# Patient Record
Sex: Male | Born: 1935 | Race: White | Hispanic: No | Marital: Single | Smoking: Former smoker
Health system: Southern US, Community
[De-identification: ages and names within clinical notes are randomized; demographics above are authoritative.]

## PROBLEM LIST (undated history)

## (undated) DIAGNOSIS — R4781 Slurred speech: Secondary | ICD-10-CM

## (undated) DIAGNOSIS — R531 Weakness: Secondary | ICD-10-CM

## (undated) DIAGNOSIS — I639 Cerebral infarction, unspecified: Secondary | ICD-10-CM

## (undated) DIAGNOSIS — Z720 Tobacco use: Secondary | ICD-10-CM

## (undated) DIAGNOSIS — I10 Essential (primary) hypertension: Secondary | ICD-10-CM

## (undated) DIAGNOSIS — J449 Chronic obstructive pulmonary disease, unspecified: Secondary | ICD-10-CM

---

## 2016-01-03 ENCOUNTER — Encounter: Payer: Self-pay | Admitting: Podiatry

## 2016-01-03 ENCOUNTER — Ambulatory Visit (INDEPENDENT_AMBULATORY_CARE_PROVIDER_SITE_OTHER): Payer: No Typology Code available for payment source | Admitting: Podiatry

## 2016-01-03 DIAGNOSIS — B351 Tinea unguium: Secondary | ICD-10-CM

## 2016-01-03 DIAGNOSIS — M79676 Pain in unspecified toe(s): Secondary | ICD-10-CM

## 2016-01-03 DIAGNOSIS — Q828 Other specified congenital malformations of skin: Secondary | ICD-10-CM

## 2016-01-03 NOTE — Progress Notes (Signed)
   Subjective:    Patient ID: Erik Carey, male    DOB: [birth year], 81 y.o.   MRN: [id number]  HPI: He presents today chief complaint of painful lesions sub-first metatarsophalangeal joint of the left foot. He denies any trauma states this been there for about 6 months denies any foreign bodies. He states that his toenails are long thick and painful Hemovac had been trimmed if possible.    Review of Systems  Constitutional: Positive for diaphoresis and fatigue.  HENT: Positive for hearing loss, sinus pressure, sneezing and sore throat.   Eyes: Positive for itching and visual disturbance.  Respiratory: Positive for apnea, cough, shortness of breath and wheezing.   Gastrointestinal: Positive for diarrhea.  Endocrine: Positive for polydipsia and polyuria.  Musculoskeletal: Positive for arthralgias and gait problem.  Neurological: Positive for dizziness, weakness, light-headedness and numbness.  All other systems reviewed and are negative.      Objective:   Physical Exam: Vital signs are stable he is alert and oriented 3. Pulses are strongly palpable. Neurologic sensorium is intact. Degenerative flexor intact. Orthopedic evaluation shows all joints distal ankle full range of motion but crepitation. Cutaneous evaluation shows supple well-hydrated cutis no erythema edema saline as drainage or odor mild xerotic tissue to the plantar aspect of bilateral foot but states it's been like this for his entire life. He has a radioactive hyperkeratotic cornified lesion to the plantar aspect of the first metatarsophalangeal joint of the left foot with thick yellow dystrophic onychomycotic nails bilaterally.        Assessment & Plan:  Pain limb secondary to onychomycosis and porokeratosis.  Plan: Debridement of all reactive hyperkeratoses and debridement of nails 1 through 5 bilateral.

## 2017-07-31 ENCOUNTER — Ambulatory Visit (INDEPENDENT_AMBULATORY_CARE_PROVIDER_SITE_OTHER): Payer: Medicare Other | Admitting: Podiatry

## 2017-07-31 ENCOUNTER — Encounter: Payer: Self-pay | Admitting: Podiatry

## 2017-07-31 DIAGNOSIS — M79676 Pain in unspecified toe(s): Secondary | ICD-10-CM | POA: Diagnosis not present

## 2017-07-31 DIAGNOSIS — B351 Tinea unguium: Secondary | ICD-10-CM

## 2017-07-31 DIAGNOSIS — Q828 Other specified congenital malformations of skin: Secondary | ICD-10-CM | POA: Diagnosis not present

## 2017-07-31 NOTE — Progress Notes (Signed)
Complaint:  Visit Type: Patient returns to my office for continued preventative foot care services. Complaint: Patient states" my nails have grown long and thick and become painful to walk and wear shoes" Patient has a painful callus on the bottom of his left foot.  He says this callus is painful walking and wearing his shoes . The patient presents for preventative foot care services. No changes to ROS  Podiatric Exam: Vascular: dorsalis pedis and posterior tibial pulses are palpable bilateral. Capillary return is immediate. Temperature gradient is WNL. Skin turgor WNL  Sensorium: Normal Semmes Weinstein monofilament test. Normal tactile sensation bilaterally. Nail Exam: Pt has thick disfigured discolored nails with subungual debris noted bilateral entire nail hallux through fifth toenails Ulcer Exam: There is no evidence of ulcer or pre-ulcerative changes or infection. Orthopedic Exam: Muscle tone and strength are WNL. No limitations in general ROM. No crepitus or effusions noted. Foot type and digits show no abnormalities. Bony prominences are unremarkable. Skin:  Porokeratosis sub 1 left foot.. No infection or ulcers  Diagnosis:  Onychomycosis, , Pain in right toe, pain in left toes  Porokeratosis left foot.  Treatment & Plan Procedures and Treatment: Consent by patient was obtained for treatment procedures.   Debridement of mycotic and hypertrophic toenails, 1 through 5 bilateral and clearing of subungual debris. No ulceration, no infection noted. Debride porokeratosis Return Visit-Office Procedure: Patient instructed to return to the office for a follow up visit 3 months for continued evaluation and treatment.    Gardiner Barefoot DPM

## 2017-08-27 ENCOUNTER — Telehealth: Payer: Self-pay | Admitting: Podiatry

## 2017-08-27 NOTE — Telephone Encounter (Signed)
This is Erik Carey calling from the [facility]. We are needing the office visit notes from date of service 31 July 2017. Please fax those to [phone]. Thank you and have a great day.

## 2017-11-03 ENCOUNTER — Ambulatory Visit: Payer: No Typology Code available for payment source | Admitting: Podiatry

## 2017-11-13 ENCOUNTER — Ambulatory Visit: Payer: Non-veteran care | Admitting: Podiatry

## 2017-11-20 ENCOUNTER — Ambulatory Visit (INDEPENDENT_AMBULATORY_CARE_PROVIDER_SITE_OTHER): Payer: Medicare Other | Admitting: Podiatry

## 2017-11-20 ENCOUNTER — Encounter: Payer: Self-pay | Admitting: Podiatry

## 2017-11-20 DIAGNOSIS — B351 Tinea unguium: Secondary | ICD-10-CM | POA: Diagnosis not present

## 2017-11-20 DIAGNOSIS — M79676 Pain in unspecified toe(s): Secondary | ICD-10-CM

## 2017-11-20 NOTE — Progress Notes (Signed)
Complaint:  Visit Type: Patient returns to my office for continued preventative foot care services. Complaint: Patient states" my nails have grown long and thick and become painful to walk and wear shoes" Patient has a painful callus on the bottom of his left foot.  He says this callus is painful walking and wearing his shoes . The patient presents for preventative foot care services. No changes to ROS  Podiatric Exam: Vascular: dorsalis pedis and posterior tibial pulses are palpable bilateral. Capillary return is immediate. Temperature gradient is WNL. Skin turgor WNL  Sensorium: Normal Semmes Weinstein monofilament test. Normal tactile sensation bilaterally. Nail Exam: Pt has thick disfigured discolored nails with subungual debris noted bilateral entire nail hallux through fifth toenails Ulcer Exam: There is no evidence of ulcer or pre-ulcerative changes or infection. Orthopedic Exam: Muscle tone and strength are WNL. No limitations in general ROM. No crepitus or effusions noted. Foot type and digits show no abnormalities. Bony prominences are unremarkable. Skin:  Porokeratosis sub 1 left foot.. No infection or ulcers  Diagnosis:  Onychomycosis, , Pain in right toe, pain in left toes   Treatment & Plan Procedures and Treatment: Consent by patient was obtained for treatment procedures.   Debridement of mycotic and hypertrophic toenails, 1 through 5 bilateral and clearing of subungual debris. No ulceration, no infection noted.  Return Visit-Office Procedure: Patient instructed to return to the office for a follow up visit 3 months for continued evaluation and treatment.    Gardiner Barefoot DPM

## 2017-12-15 ENCOUNTER — Emergency Department: Payer: Medicare Other

## 2017-12-15 ENCOUNTER — Other Ambulatory Visit: Payer: Self-pay

## 2017-12-15 ENCOUNTER — Inpatient Hospital Stay
Admission: EM | Admit: 2017-12-15 | Discharge: 2017-12-18 | DRG: 872 | Disposition: A | Discharge status: Skilled Nursing Facility | Payer: Medicare Other | Attending: Internal Medicine | Admitting: Internal Medicine

## 2017-12-15 DIAGNOSIS — J441 Chronic obstructive pulmonary disease with (acute) exacerbation: Secondary | ICD-10-CM | POA: Diagnosis present

## 2017-12-15 DIAGNOSIS — N179 Acute kidney failure, unspecified: Secondary | ICD-10-CM

## 2017-12-15 DIAGNOSIS — L03116 Cellulitis of left lower limb: Secondary | ICD-10-CM | POA: Diagnosis present

## 2017-12-15 DIAGNOSIS — Z831 Family history of other infectious and parasitic diseases: Secondary | ICD-10-CM | POA: Diagnosis not present

## 2017-12-15 DIAGNOSIS — K529 Noninfective gastroenteritis and colitis, unspecified: Secondary | ICD-10-CM | POA: Diagnosis present

## 2017-12-15 DIAGNOSIS — Z7951 Long term (current) use of inhaled steroids: Secondary | ICD-10-CM | POA: Diagnosis not present

## 2017-12-15 DIAGNOSIS — L89220 Pressure ulcer of left hip, unstageable: Secondary | ICD-10-CM

## 2017-12-15 DIAGNOSIS — F17210 Nicotine dependence, cigarettes, uncomplicated: Secondary | ICD-10-CM | POA: Diagnosis present

## 2017-12-15 DIAGNOSIS — I10 Essential (primary) hypertension: Secondary | ICD-10-CM | POA: Diagnosis present

## 2017-12-15 DIAGNOSIS — Z66 Do not resuscitate: Secondary | ICD-10-CM | POA: Diagnosis present

## 2017-12-15 DIAGNOSIS — E860 Dehydration: Secondary | ICD-10-CM | POA: Diagnosis present

## 2017-12-15 DIAGNOSIS — W06XXXA Fall from bed, initial encounter: Secondary | ICD-10-CM | POA: Diagnosis present

## 2017-12-15 DIAGNOSIS — Z79899 Other long term (current) drug therapy: Secondary | ICD-10-CM | POA: Diagnosis not present

## 2017-12-15 DIAGNOSIS — Z716 Tobacco abuse counseling: Secondary | ICD-10-CM | POA: Diagnosis not present

## 2017-12-15 DIAGNOSIS — L304 Erythema intertrigo: Secondary | ICD-10-CM | POA: Diagnosis present

## 2017-12-15 DIAGNOSIS — M6282 Rhabdomyolysis: Secondary | ICD-10-CM | POA: Diagnosis present

## 2017-12-15 DIAGNOSIS — Z8673 Personal history of transient ischemic attack (TIA), and cerebral infarction without residual deficits: Secondary | ICD-10-CM

## 2017-12-15 DIAGNOSIS — L89222 Pressure ulcer of left hip, stage 2: Secondary | ICD-10-CM | POA: Diagnosis present

## 2017-12-15 DIAGNOSIS — Z23 Encounter for immunization: Secondary | ICD-10-CM

## 2017-12-15 DIAGNOSIS — A419 Sepsis, unspecified organism: Secondary | ICD-10-CM | POA: Diagnosis not present

## 2017-12-15 DIAGNOSIS — L8990 Pressure ulcer of unspecified site, unspecified stage: Secondary | ICD-10-CM

## 2017-12-15 HISTORY — DX: Cerebral infarction, unspecified: I63.9

## 2017-12-15 HISTORY — DX: Chronic obstructive pulmonary disease, unspecified: J44.9

## 2017-12-15 HISTORY — DX: Tobacco use: Z72.0

## 2017-12-15 HISTORY — DX: Slurred speech: R47.81

## 2017-12-15 HISTORY — DX: Weakness: R53.1

## 2017-12-15 HISTORY — DX: Essential (primary) hypertension: I10

## 2017-12-15 LAB — URINALYSIS, COMPLETE (UACMP) WITH MICROSCOPIC
BACTERIA UA: NONE SEEN
BILIRUBIN URINE: NEGATIVE
Glucose, UA: NEGATIVE mg/dL
KETONES UR: 20 mg/dL — AB
Leukocytes, UA: NEGATIVE
Nitrite: NEGATIVE
PROTEIN: 100 mg/dL — AB
SPECIFIC GRAVITY, URINE: 1.021 (ref 1.005–1.030)
pH: 5 (ref 5.0–8.0)

## 2017-12-15 LAB — COMPREHENSIVE METABOLIC PANEL
ALT: 27 U/L (ref 0–44)
AST: 136 U/L — ABNORMAL HIGH (ref 15–41)
Albumin: 3.6 g/dL (ref 3.5–5.0)
Alkaline Phosphatase: 61 U/L (ref 38–126)
Anion gap: 16 — ABNORMAL HIGH (ref 5–15)
BILIRUBIN TOTAL: 2.1 mg/dL — AB (ref 0.3–1.2)
BUN: 60 mg/dL — AB (ref 8–23)
CO2: 22 mmol/L (ref 22–32)
CREATININE: 1.92 mg/dL — AB (ref 0.61–1.24)
Calcium: 9 mg/dL (ref 8.9–10.3)
Chloride: 105 mmol/L (ref 98–111)
GFR calc Af Amer: 36 mL/min — ABNORMAL LOW (ref >60)
GFR, EST NON AFRICAN AMERICAN: 31 mL/min — AB (ref >60)
GLUCOSE: 108 mg/dL — AB (ref 70–99)
Potassium: 4.2 mmol/L (ref 3.5–5.1)
Sodium: 143 mmol/L (ref 135–145)
TOTAL PROTEIN: 7.2 g/dL (ref 6.5–8.1)

## 2017-12-15 LAB — CBC
HEMATOCRIT: 45.4 % (ref 40.0–52.0)
Hemoglobin: 15.8 g/dL (ref 13.0–18.0)
MCH: 32.6 pg (ref 26.0–34.0)
MCHC: 34.8 g/dL (ref 32.0–36.0)
MCV: 93.6 fL (ref 80.0–100.0)
Platelets: 180 10*3/uL (ref 150–440)
RBC: 4.85 MIL/uL (ref 4.40–5.90)
RDW: 15.4 % — ABNORMAL HIGH (ref 11.5–14.5)
WBC: 21.3 10*3/uL — ABNORMAL HIGH (ref 3.8–10.6)

## 2017-12-15 LAB — LACTIC ACID, PLASMA
LACTIC ACID, VENOUS: 1.5 mmol/L (ref 0.5–1.9)
LACTIC ACID, VENOUS: 1.4 mmol/L (ref 0.5–1.9)

## 2017-12-15 LAB — CK: CK TOTAL: 4550 U/L — AB (ref 49–397)

## 2017-12-15 MED ORDER — SODIUM CHLORIDE 0.9 % IV BOLUS
1000.0000 mL | Freq: Once | INTRAVENOUS | Status: AC
  Administered 2017-12-15: 1000 mL via INTRAVENOUS

## 2017-12-15 MED ORDER — ENOXAPARIN SODIUM 40 MG/0.4ML ~~LOC~~ SOLN
40.0000 mg | SUBCUTANEOUS | Status: DC
  Administered 2017-12-15 – 2017-12-17 (×3): 40 mg via SUBCUTANEOUS
  Filled 2017-12-15 (×3): qty 0.4

## 2017-12-15 MED ORDER — COLLAGENASE 250 UNIT/GM EX OINT
TOPICAL_OINTMENT | Freq: Every day | CUTANEOUS | Status: DC
  Administered 2017-12-15 – 2017-12-18 (×4): via TOPICAL
  Filled 2017-12-15: qty 30

## 2017-12-15 MED ORDER — ALBUTEROL SULFATE (2.5 MG/3ML) 0.083% IN NEBU
2.5000 mg | INHALATION_SOLUTION | RESPIRATORY_TRACT | Status: DC | PRN
  Administered 2017-12-16: 16:00:00 2.5 mg via RESPIRATORY_TRACT
  Filled 2017-12-15: qty 3

## 2017-12-15 MED ORDER — ACETAMINOPHEN 325 MG PO TABS
650.0000 mg | ORAL_TABLET | Freq: Four times a day (QID) | ORAL | Status: DC | PRN
  Administered 2017-12-18: 650 mg via ORAL
  Filled 2017-12-15: qty 2

## 2017-12-15 MED ORDER — HYDROCODONE-ACETAMINOPHEN 5-325 MG PO TABS
1.0000 | ORAL_TABLET | ORAL | Status: DC | PRN
Start: 2017-12-15 — End: 2017-12-18
  Administered 2017-12-15: 17:00:00 1 via ORAL
  Administered 2017-12-16: 16:00:00 2 via ORAL
  Administered 2017-12-16: 11:00:00 1 via ORAL
  Filled 2017-12-15 (×2): qty 2
  Filled 2017-12-15 (×2): qty 1

## 2017-12-15 MED ORDER — ONDANSETRON HCL 4 MG PO TABS: 4.0000 mg | ORAL_TABLET | Freq: Four times a day (QID) | ORAL | Status: DC | PRN

## 2017-12-15 MED ORDER — CEFAZOLIN SODIUM-DEXTROSE 1-4 GM/50ML-% IV SOLN
1.0000 g | Freq: Three times a day (TID) | INTRAVENOUS | Status: DC
  Administered 2017-12-15 – 2017-12-18 (×9): 1 g via INTRAVENOUS
  Filled 2017-12-15 (×13): qty 50

## 2017-12-15 MED ORDER — ONDANSETRON HCL 4 MG/2ML IJ SOLN: 4.0000 mg | Freq: Four times a day (QID) | INTRAMUSCULAR | Status: DC | PRN

## 2017-12-15 MED ORDER — TETANUS-DIPHTH-ACELL PERTUSSIS 5-2.5-18.5 LF-MCG/0.5 IM SUSP
0.5000 mL | Freq: Once | INTRAMUSCULAR | Status: AC
  Administered 2017-12-15: 0.5 mL via INTRAMUSCULAR
  Filled 2017-12-15: qty 0.5

## 2017-12-15 MED ORDER — IPRATROPIUM-ALBUTEROL 0.5-2.5 (3) MG/3ML IN SOLN
3.0000 mL | Freq: Four times a day (QID) | RESPIRATORY_TRACT | Status: DC
  Administered 2017-12-15 – 2017-12-16 (×3): 3 mL via RESPIRATORY_TRACT
  Filled 2017-12-15 (×3): qty 3

## 2017-12-15 MED ORDER — SODIUM CHLORIDE 0.9 % IV SOLN
INTRAVENOUS | Status: DC
  Administered 2017-12-15 – 2017-12-17 (×5): via INTRAVENOUS

## 2017-12-15 MED ORDER — POLYETHYLENE GLYCOL 3350 17 G PO PACK: 17.0000 g | PACK | Freq: Every day | ORAL | Status: DC | PRN

## 2017-12-15 MED ORDER — ACETAMINOPHEN 650 MG RE SUPP: 650.0000 mg | Freq: Four times a day (QID) | RECTAL | Status: DC | PRN

## 2017-12-15 MED ORDER — SODIUM CHLORIDE 0.9 % IV SOLN
2.0000 g | Freq: Once | INTRAVENOUS | Status: AC
  Administered 2017-12-15: 2 g via INTRAVENOUS
  Filled 2017-12-15: qty 20

## 2017-12-15 NOTE — ED Notes (Signed)
Pt given water and assisted with urinal at this time

## 2017-12-15 NOTE — Evaluation (Signed)
Physical Therapy Evaluation Patient Details Name: Erik Carey MRN: [id number] DOB: [birth year] Today's Date: 12/15/2017   History of Present Illness  83 yo male with onset of fall at home and was in the floor 3 days.  Has wounds from pressure on L Shoulder, L trochanter.  Chronic diarrhea and generalized weakness, sepsis, scrotal skin irritation.  PMHx:  COPD, CVA, chronic diarrhea, L hemiparesis, tobacco use, HTN  Clinical Impression  Pt is appropriate for SNF as he is too painful and weak to move effectively as well as having wounds to heal.  Progress gait and transfers as needed and able, as well as focusing on protection of skin during mobility. Follow acutely to decrease time needed in SNF.    Follow Up Recommendations SNF    Equipment Recommendations  None recommended by PT    Recommendations for Other Services       Precautions / Restrictions Precautions Precautions: Fall Restrictions Weight Bearing Restrictions: No      Mobility  Bed Mobility Overal bed mobility: Needs Assistance Bed Mobility: Supine to Sit;Sit to Supine     Supine to sit: Mod assist Sit to supine: Max assist   General bed mobility comments: painful and requires help to pivot and avoid stress on peri area  Transfers Overall transfer level: Needs assistance Equipment used: 1 person hand held assist Transfers: Sit to/from Stand Sit to Stand: Mod assist         General transfer comment: tolerates standing only briefly due to pain  Ambulation/Gait             General Gait Details: unable to tolerate  Stairs            Wheelchair Mobility    Modified Rankin (Stroke Patients Only)       Balance Overall balance assessment: Needs assistance Sitting-balance support: Feet supported;Bilateral upper extremity supported Sitting balance-Leahy Scale: Fair     Standing balance support: Single extremity supported;During functional activity Standing balance-Leahy Scale: Poor                                Pertinent Vitals/Pain Pain Assessment: Faces Faces Pain Scale: Hurts whole lot Pain Location: scrotum and L hip and shoulder Pain Descriptors / Indicators: Burning;Sharp;Tender Pain Intervention(s): Limited activity within patient's tolerance;Monitored during session;Premedicated before session;Repositioned    Home Living Family/patient expects to be discharged to:: Skilled nursing facility Living Arrangements: Alone                    Prior Function Level of Independence: Independent with assistive device(s)         Comments: used SPC with no assist previously     Hand Dominance        Extremity/Trunk Assessment   Upper Extremity Assessment Upper Extremity Assessment: Generalized weakness    Lower Extremity Assessment Lower Extremity Assessment: Generalized weakness    Cervical / Trunk Assessment Cervical / Trunk Assessment: Kyphotic  Communication   Communication: No difficulties  Cognition Arousal/Alertness: Lethargic Behavior During Therapy: Anxious Overall Cognitive Status: Within Functional Limits for tasks assessed                                 General Comments: pt is struggling to move due to skin breakdown      General Comments      Exercises     Assessment/Plan  PT Assessment Patient needs continued PT services  PT Problem List Decreased strength;Decreased range of motion;Decreased activity tolerance;Decreased balance;Decreased mobility;Decreased coordination;Decreased safety awareness;Pain;Decreased skin integrity;Obesity       PT Treatment Interventions DME instruction;Stair training;Gait training;Functional mobility training;Therapeutic activities;Balance training;Therapeutic exercise;Neuromuscular re-education;Patient/family education    PT Goals (Current goals can be found in the Care Plan section)  Acute Rehab PT Goals Patient Stated Goal: to avoid hurting PT Goal  Formulation: With patient/family Time For Goal Achievement: 12/29/17 Potential to Achieve Goals: Good    Frequency Min 2X/week   Barriers to discharge Decreased caregiver support;Inaccessible home environment home alone wiht stairs to enter house    Co-evaluation               AM-PAC PT "6 Clicks" Daily Activity  Outcome Measure Difficulty turning over in bed (including adjusting bedclothes, sheets and blankets)?: Unable Difficulty moving from lying on back to sitting on the side of the bed? : Unable Difficulty sitting down on and standing up from a chair with arms (e.g., wheelchair, bedside commode, etc,.)?: Unable Help needed moving to and from a bed to chair (including a wheelchair)?: A Lot Help needed walking in hospital room?: Total Help needed climbing 3-5 steps with a railing? : Total 6 Click Score: 7    End of Session   Activity Tolerance: Patient limited by pain;Treatment limited secondary to medical complications (Comment) Patient left: in bed;with call bell/phone within reach;with bed alarm set;with family/visitor present;with nursing/sitter in room Nurse Communication: Mobility status PT Visit Diagnosis: Unsteadiness on feet (R26.81);Muscle weakness (generalized) (M62.81);History of falling (Z91.81);Difficulty in walking, not elsewhere classified (R26.2);Pain Pain - Right/Left: Left Pain - part of body: Shoulder;Hip    Time: 2876-8115 PT Time Calculation (min) (ACUTE ONLY): 32 min   Charges:   PT Evaluation $PT Eval Moderate Complexity: 1 Mod PT Treatments $Therapeutic Activity: 8-22 mins        Ramond Dial 12/15/2017, 7:56 PM   Mee Hives, PT MS Acute Rehab Dept. Number: [place]

## 2017-12-15 NOTE — H&P (Addendum)
[facility] [place] NAME: Erik Carey    MR#:  [id number]  DATE OF BIRTH:  Oct 02, 1934  DATE OF ADMISSION:  12/15/2017  PRIMARY CARE PHYSICIAN: Raelyn Mora, MD   REQUESTING/REFERRING PHYSICIAN: Dr. Jacqualine Code  CHIEF COMPLAINT:   Chief Complaint  Patient presents with  . Fall    HISTORY OF PRESENT ILLNESS:  Erik Carey  is a 83 y.o. male with a known history of hypertension, COPD, CVA, chronic left weakness, chronic diarrhea presents to the emergency room brought in by the daughter after he has been laying on the floor for 3 days with urine and stool around him.  Patient does have chronic diarrhea which had been extensively worked up in the past by PCP with no cause found.  He could not get up after a fall.  At baseline he ambulates with a walker or holding onto things at home.  He complains of some left hip pain.  An x-ray has been done with no fractures.  He does have multiple decubitus pressure ulcers.  Surrounding erythema and some pain on the left hip wound.  No purulent discharge.  Found to have WBC 21,000 with heart rate 97.  Sepsis present on admission.   PAST MEDICAL HISTORY:   Past Medical History:  Diagnosis Date  . COPD (chronic obstructive pulmonary disease) ([place])   . CVA (cerebral vascular accident) ([place])   . Hypertension   . Left-sided weakness   . Slurred speech   . Tobacco use     PAST SURGICAL HISTORY:  History reviewed. No pertinent surgical history.  SOCIAL HISTORY:   Social History   Tobacco Use  . Smoking status: Unknown If Ever Smoked  . Smokeless tobacco: Never Used  Substance Use Topics  . Alcohol use: Not Currently    FAMILY HISTORY:   Family History  Problem Relation Age of Onset  . Tuberculosis Mother     DRUG ALLERGIES:  No Known Allergies  REVIEW OF SYSTEMS:   Review of Systems  Constitutional: Positive for malaise/fatigue. Negative for chills, fever and weight loss.  HENT: Negative  for hearing loss and nosebleeds.   Eyes: Negative for blurred vision, double vision and pain.  Respiratory: Positive for cough, shortness of breath and wheezing. Negative for hemoptysis and sputum production.   Cardiovascular: Negative for chest pain, palpitations, orthopnea and leg swelling.  Gastrointestinal: Positive for diarrhea. Negative for abdominal pain, constipation, nausea and vomiting.  Genitourinary: Negative for dysuria and hematuria.  Musculoskeletal: Positive for falls. Negative for back pain and myalgias.  Skin: Negative for rash.  Neurological: Negative for dizziness, tremors, sensory change, speech change, focal weakness, seizures and headaches.  Endo/Heme/Allergies: Does not bruise/bleed easily.  Psychiatric/Behavioral: Negative for depression and memory loss. The patient is not nervous/anxious.     MEDICATIONS AT HOME:   Prior to Admission medications   Medication Sig Start Date End Date Taking? Authorizing Provider  diphenoxylate-atropine (LOMOTIL) 2.5-0.025 MG tablet Take by mouth 4 (four) times daily as needed for diarrhea or loose stools.   Yes [provider]  allopurinol (ZYLOPRIM) 100 MG tablet Take 100 mg by mouth daily.    [provider]  budesonide-formoterol (SYMBICORT) 80-4.5 MCG/ACT inhaler Inhale 2 puffs into the lungs 2 (two) times daily.    [provider]  Chlorpheniramine Maleate (ALLERGY RELIEF PO) Take by mouth.    [provider]  hydrochlorothiazide (HYDRODIURIL) 25 MG tablet Take 25 mg by mouth daily.  [provider]  lisinopril (PRINIVIL,ZESTRIL) 40 MG tablet Take 40 mg by mouth daily.    [provider]  metoprolol succinate (TOPROL-XL) 50 MG 24 hr tablet Take 50 mg by mouth daily. Take with or immediately following a meal.    [provider]  pantoprazole (PROTONIX) 40 MG tablet Take 40 mg by mouth daily.    [provider]  simvastatin (ZOCOR) 40 MG tablet Take 40 mg by  mouth daily.    [provider]  tamsulosin (FLOMAX) 0.4 MG CAPS capsule Take 0.4 mg by mouth.    [provider]  tiotropium (SPIRIVA) 18 MCG inhalation capsule Place 18 mcg into inhaler and inhale daily.    [provider]     VITAL SIGNS:  Blood pressure 140/63, pulse 89, temperature 98.9 F (37.2 C), temperature source Oral, resp. rate (!) 22, height 5\' 9"  (1.753 m), weight 104.3 kg, SpO2 96 %.  PHYSICAL EXAMINATION:  Physical Exam  GENERAL:  83 y.o.-year-old patient lying in the bed with no acute distress.  EYES: Pupils equal, round, reactive to light and accommodation. No scleral icterus. Extraocular muscles intact.  HEENT: Head atraumatic, normocephalic. Oropharynx and nasopharynx clear. No oropharyngeal erythema, moist oral mucosa  NECK:  Supple, no jugular venous distention. No thyroid enlargement, no tenderness.  LUNGS: Good air entry with bilateral wheezing CARDIOVASCULAR: S1, S2 normal. No murmurs, rubs, or gallops.  ABDOMEN: Soft, nontender, nondistended. Bowel sounds present. No organomegaly or mass.  EXTREMITIES: No pedal edema, cyanosis, or clubbing. + 2 pedal & radial pulses b/l.   NEUROLOGIC: Cranial nerves II through XII are intact.  Left motor strength less than right motor strength And speech PSYCHIATRIC: The patient is alert and oriented x 3. Good affect.  SKIN: Multiple decubitus ulcers with a large wound present on the  left hip greater trochanter area.  Surrounding erythema. no discharge  LABORATORY PANEL:   CBC Recent Labs  Lab 12/15/17 1118  WBC 21.3*  HGB 15.8  HCT 45.4  PLT 180   ------------------------------------------------------------------------------------------------------------------  Chemistries  Recent Labs  Lab 12/15/17 1118  NA 143  K 4.2  CL 105  CO2 22  GLUCOSE 108*  BUN 60*  CREATININE 1.92*  CALCIUM 9.0  AST 136*  ALT 27  ALKPHOS 61  BILITOT 2.1*    ------------------------------------------------------------------------------------------------------------------  Cardiac Enzymes No results for input(s): TROPONINI in the last 168 hours. ------------------------------------------------------------------------------------------------------------------  RADIOLOGY:  Ct Head Wo Contrast  Result Date: 12/15/2017 CLINICAL DATA:  83 year old male with fall and head injury. Initial encounter. EXAM: CT HEAD WITHOUT CONTRAST TECHNIQUE: Contiguous axial images were obtained from the base of the skull through the vertex without intravenous contrast. COMPARISON:  None. FINDINGS: Brain: No evidence of acute infarction, hemorrhage, extra-axial collection or mass lesion/mass effect. Moderate ventriculomegaly identified. Atrophy, chronic small-vessel white matter ischemic changes and remote RIGHT parietal infarct noted. Vascular: Intracranial atherosclerotic calcifications noted. Skull: Normal. Negative for fracture or focal lesion. Sinuses/Orbits: No acute finding. Other: None. IMPRESSION: 1. No evidence of acute traumatic injury. 2. Moderate ventriculomegaly which may be related to central atrophy/chronic small-vessel ischemic changes but communicating hydrocephalus is not entirely excluded. 3. Atrophy, chronic small-vessel white matter ischemic changes and remote RIGHT parietal infarct. Electronically Signed   By: Margarette Canada M.D.   On: 12/15/2017 11:39   Dg Hip Unilat W Or Wo Pelvis 2-3 Views Left  Result Date: 12/15/2017 CLINICAL DATA:  Left hip pain after fall 3 days ago. EXAM: DG HIP (WITH OR WITHOUT PELVIS)  2-3V LEFT COMPARISON:  None. FINDINGS: There is no evidence of hip fracture or dislocation. There is no evidence of arthropathy or other focal bone abnormality. IMPRESSION: Normal left hip. Electronically Signed   By: Marijo Conception, M.D.   On: 12/15/2017 11:56     IMPRESSION AND PLAN:   *Left hip cellulitis surrounding pressure ulcer with  sepsis present on admission Check lactic acid.  Ordered blood cultures.  Will start Keflex.  No risk for multidrug-resistant organisms.  Elevated white count and tachycardia on presentation Repeat labs in the morning.  Wait for cultures. Wound does not have any purulent discharge.  *Acute rhabdomyolysis due to prolonged immobilization and dehydration.  Start IV fluids.  Repeat CK in the morning.  *Acute kidney injury.  Baseline unknown.  Likely due to rhabdomyolysis and dehydration.  Creatinine 1.92.  IV fluids started.  Monitor input and output.  Repeat labs in the morning.  *Hypertension.  We will continue home medications when available. IV hydralazine ordered  *Generalized weakness due to dehydration.  Will have physical therapy evaluate in the morning  *Chronic diarrhea.  According to patient he has 7-8 loose to watery stools daily.  Has had extensive work-up as outpatient with no cause found.  *Tobacco abuse.  Counseled patient to quit smoking for greater than 3 minutes per  *DVT prophylaxis with Lovenox  All the records are reviewed and case discussed with ED provider. Management plans discussed with the patient, family and they are in agreement.  CODE STATUS: DNR/DNI  TOTAL TIME TAKING CARE OF THIS PATIENT: 35 minutes.   Neita Carp M.D on 12/15/2017 at 2:20 PM  Between 7am to 6pm - Pager - [phone]  After 6pm go to [email or web address] - password [email or web address] [facility]  Office  [phone]  CC: Primary care physician; Raelyn Mora, MD  Note: This dictation was prepared with Dragon dictation along with smaller phrase technology. Any transcriptional errors that result from this process are unintentional.

## 2017-12-15 NOTE — Progress Notes (Signed)
Advance care planning  Discussed with patient with his healthcare power of attorney daughter at bedside.  He does have healthcare power of attorney documentation.  Patient is alert and oriented and able to make decisions.  He will let his daughter dry most of the communication but confirmed all the findings.  We discussed regarding acute hospitalization for rhabdomyolysis, this COPD is acute kidney injury.  CODE STATUS has been discussed.  Daughter tells me that patient wants to be DO NOT RESUSCITATE and do not intubated and has had this conversation before.  Confirmed with patient. Orders entered  Time spent - 17 minutes

## 2017-12-15 NOTE — ED Notes (Signed)
Called pharmacy and waiting on medication

## 2017-12-15 NOTE — Clinical Social Work Note (Signed)
CSW received consult for potential placement. PT is currently pending. CSW will follow and watch for PT recommendation.   [place], [place]

## 2017-12-15 NOTE — ED Notes (Signed)
Patient transported to CT 

## 2017-12-15 NOTE — ED Notes (Signed)
Pt being cleaned up at this time by EDT Threasa Beards and Denny Peon

## 2017-12-15 NOTE — ED Provider Notes (Signed)
[facility] Emergency Department Provider Note   ____________________________________________   First MD Initiated Contact with Patient 12/15/17 1117     (approximate)  I have reviewed the triage vital signs and the nursing notes.   HISTORY  Chief Complaint Fall    HPI Erik Carey is a 83 y.o. male history of COPD and hypertension  Patient reports he does not remember everything well because it was a few days ago, but he fell he thinks getting up out of bed.  He fell onto his left hip.  Not really sure why or how it happened.  Did not pass out and does not think he got injured except he is very sore over his left hip area.  He has not been able to get up and has been urinating on himself, unable to eat or drink anything, and laying and diarrhea for about 3 days now.  Reports he does have discomfort and thinks he developed a rash over the left hip.  His daughter found him today and called EMS  No chest pain.  No trouble breathing.  No nausea vomiting.  Reports a moderate pain over the left hip where he reports he thinks is developing a "rash".     Past Medical History:  Diagnosis Date  . COPD (chronic obstructive pulmonary disease) ([place])   . Hypertension     There are no active problems to display for this patient.   History reviewed. No pertinent surgical history.  Prior to Admission medications   Medication Sig Start Date End Date Taking? Authorizing Provider  diphenoxylate-atropine (LOMOTIL) 2.5-0.025 MG tablet Take by mouth 4 (four) times daily as needed for diarrhea or loose stools.   Yes [provider]  allopurinol (ZYLOPRIM) 100 MG tablet Take 100 mg by mouth daily.    [provider]  budesonide-formoterol (SYMBICORT) 80-4.5 MCG/ACT inhaler Inhale 2 puffs into the lungs 2 (two) times daily.    [provider]  Chlorpheniramine Maleate (ALLERGY RELIEF PO) Take by mouth.    [provider]    hydrochlorothiazide (HYDRODIURIL) 25 MG tablet Take 25 mg by mouth daily.    [provider]  lisinopril (PRINIVIL,ZESTRIL) 40 MG tablet Take 40 mg by mouth daily.    [provider]  metoprolol succinate (TOPROL-XL) 50 MG 24 hr tablet Take 50 mg by mouth daily. Take with or immediately following a meal.    [provider]  pantoprazole (PROTONIX) 40 MG tablet Take 40 mg by mouth daily.    [provider]  simvastatin (ZOCOR) 40 MG tablet Take 40 mg by mouth daily.    [provider]  tamsulosin (FLOMAX) 0.4 MG CAPS capsule Take 0.4 mg by mouth.    [provider]  tiotropium (SPIRIVA) 18 MCG inhalation capsule Place 18 mcg into inhaler and inhale daily.    [provider]    Allergies Patient has no known allergies.  No family history on file.  Social History Social History   Tobacco Use  . Smoking status: Unknown If Ever Smoked  . Smokeless tobacco: Never Used  Substance Use Topics  . Alcohol use: Not Currently  . Drug use: Not Currently    Review of Systems Constitutional: No fever/chills Eyes: No visual changes. ENT: No sore throat.  Reports feels very thirsty. Cardiovascular: Denies chest pain. Respiratory: Denies shortness of breath. Gastrointestinal: No abdominal pain.  No nausea, no vomiting.  No diarrhea.  No constipation. Genitourinary: Negative for dysuria. Musculoskeletal: Negative  for back pain.  Reports discomfort along his left upper thigh. Skin: Negative for rash. Neurological: Negative for headaches, focal weakness or numbness.    ____________________________________________   PHYSICAL EXAM:  VITAL SIGNS: ED Triage Vitals  Enc Vitals Group     BP 12/15/17 1109 119/65     Pulse Rate 12/15/17 1109 97     Resp 12/15/17 1109 15     Temp 12/15/17 1109 98.9 F (37.2 C)     Temp Source 12/15/17 1109 Oral     SpO2 12/15/17 1109 97 %     Weight 12/15/17 1110 230 lb (104.3 kg)     Height  12/15/17 1110 5\' 9"  (1.753 m)     Head Circumference --      Peak Flow --      Pain Score 12/15/17 1109 5     Pain Loc --      Pain Edu? --      Excl. in [place]? --     Constitutional: Alert and oriented.  Moderately ill-appearing but in no distress.  Appears very dry.  Malodorous smell to the room. Eyes: Conjunctivae are normal. Head: Atraumatic. Nose: No congestion/rhinnorhea. Mouth/Throat: Mucous membranes are quite dry. Neck: No stridor.  No cervical thoracic or lumbar tenderness.  Patient is logrolled and no evident pressure sores or injuries are denoted along the patient's posterior/back.  There is no perineal redness. Cardiovascular: Normal rate, regular rhythm. Grossly normal heart sounds.  Good peripheral circulation. Respiratory: Normal respiratory effort.  No retractions. Lungs CTAB. Gastrointestinal: Soft and nontender. No distention. Patient does have erythema along bilateral groin lines with some satellite lesions. Musculoskeletal: No lower extremity tenderness nor edema except over his left upper hip where he has a appearance of what appears to be a pressure sore about the size of the palm over the left lateral hip.  There is no shortening or deformity.  The area is slightly tender.  There is no purulent drainage..  We will do move both lower extremities well but some discomfort left upper hip. Neurologic:  Normal speech and language. No gross focal neurologic deficits are appreciated.  Skin:  Skin is warm, dry and intact. No rash noted. Psychiatric: Mood and affect are normal. Speech and behavior are normal.  ____________________________________________   LABS (all labs ordered are listed, but only abnormal results are displayed)  Labs Reviewed  CBC - Abnormal; Notable for the following components:      Result Value   WBC 21.3 (*)    RDW 15.4 (*)    All other components within normal limits  COMPREHENSIVE METABOLIC PANEL - Abnormal; Notable for the following components:    Glucose, Bld 108 (*)    BUN 60 (*)    Creatinine, Ser 1.92 (*)    AST 136 (*)    Total Bilirubin 2.1 (*)    GFR calc non Af Amer 31 (*)    GFR calc Af Amer 36 (*)    Anion gap 16 (*)    All other components within normal limits  CK - Abnormal; Notable for the following components:   Total CK 4,550 (*)    All other components within normal limits  CULTURE, BLOOD (ROUTINE X 2)  CULTURE, BLOOD (ROUTINE X 2)  URINALYSIS, COMPLETE (UACMP) WITH MICROSCOPIC   ____________________________________________  EKG  Reviewed by me at 1110 Heart rate 90 Cures 100 QTc 440 Sinus tachycardia, occasional PAC ____________________________________________  RADIOLOGY  Ct Head Wo Contrast  Result Date: 12/15/2017 CLINICAL DATA:  83 year old male with fall and head injury. Initial encounter. EXAM: CT HEAD WITHOUT CONTRAST TECHNIQUE: Contiguous axial images were obtained from the base of the skull through the vertex without intravenous contrast. COMPARISON:  None. FINDINGS: Brain: No evidence of acute infarction, hemorrhage, extra-axial collection or mass lesion/mass effect. Moderate ventriculomegaly identified. Atrophy, chronic small-vessel white matter ischemic changes and remote RIGHT parietal infarct noted. Vascular: Intracranial atherosclerotic calcifications noted. Skull: Normal. Negative for fracture or focal lesion. Sinuses/Orbits: No acute finding. Other: None. IMPRESSION: 1. No evidence of acute traumatic injury. 2. Moderate ventriculomegaly which may be related to central atrophy/chronic small-vessel ischemic changes but communicating hydrocephalus is not entirely excluded. 3. Atrophy, chronic small-vessel white matter ischemic changes and remote RIGHT parietal infarct. Electronically Signed   By: Margarette Canada M.D.   On: 12/15/2017 11:39   Dg Hip Unilat W Or Wo Pelvis 2-3 Views Left  Result Date: 12/15/2017 CLINICAL DATA:  Left hip pain after fall 3 days ago. EXAM: DG HIP (WITH OR WITHOUT  PELVIS) 2-3V LEFT COMPARISON:  None. FINDINGS: There is no evidence of hip fracture or dislocation. There is no evidence of arthropathy or other focal bone abnormality. IMPRESSION: Normal left hip. Electronically Signed   By: Marijo Conception, M.D.   On: 12/15/2017 11:56     ____________________________________________   PROCEDURES  Procedure(s) performed: None  Procedures  Critical Care performed: No  ____________________________________________   INITIAL IMPRESSION / ASSESSMENT AND PLAN / ED COURSE  Pertinent labs & imaging results that were available during my care of the patient were reviewed by me and considered in my medical decision making (see chart for details).  Patient with normal hemodynamics, concerning history with fall and high risk for rhabdo.  Also notable pressure sore and intertrigo  ----------------------------------------- 12:56 PM on 12/15/2017 -----------------------------------------  Patient being admitted for further work-up.  Fluids for rhabdomyolysis, admission to the hospital, anticipate wound care consultation, further treatment and care under the hospitalist service.  Normal hemodynamics.  Appears stable for further work-up and hospitalization.  Some lab work including urinalysis are pending at this time.      ____________________________________________   FINAL CLINICAL IMPRESSION(S) / ED DIAGNOSES  Final diagnoses:  AKI (acute kidney injury) ([place])  Non-traumatic rhabdomyolysis  Intertrigo  Pressure sore of hip, left, unstageable ([facility])      NEW MEDICATIONS STARTED DURING THIS VISIT:  New Prescriptions   No medications on file     Note:  This document was prepared using Dragon voice recognition software and may include unintentional dictation errors.     Delman Kitten, MD 12/15/17 1257

## 2017-12-15 NOTE — ED Notes (Signed)
Pt has severe redness and tenderness noted to inner thighs and scrotum area. Pt states painful to touch.

## 2017-12-15 NOTE — ED Notes (Signed)
Pt back from CT and XRAY

## 2017-12-15 NOTE — Consult Note (Signed)
[place] Nurse wound consult note Reason for Consult:Fall at home with prolonged down time.  2-3 days.  Deep tissue injury to left trochanter and left arm/shoulder.  Covered in thin adherent slough will begin enzymatic debridement Wound type:trauma Pressure Injury POA: Yes Measurement:Left hip:  6 cm x 3.2 cm adherent slough to wound bed LEft arm:  2 cm x 3 cm slough to wound bed. Shoulder:  1 cm x 2 cm  Wound RNH:AFBXUX Drainage (amount, consistency, odor) none noted Periwound:Induration, erythema and tenderness Dressing procedure/placement/frequency: Cleanse abrasions to left hip, arm and shoulder with soap and water.  Apply Santyl to wound bed.  Cover with NS moist gauze.  Secure with tape. Change daily  Will not follow at this time.  Please re-consult if needed.  Domenic Moras RN BSN [place] Pager [phone]

## 2017-12-15 NOTE — ED Triage Notes (Signed)
Pt comes via [facility] from home after a fall 3 days ago. Per EMS pt's daughter couldn't get a hold of pt on Saturday or Sunday and went over today and found pt on floor in bedroom. Pt is alert and oriented. Pt doesn't remember falling. Pt has laceration noted to left hip and c/o 5/10 pain to right lower hip. VSS

## 2017-12-15 NOTE — ED Notes (Signed)
Called dietary and they will send down lunch tray for pt

## 2017-12-15 NOTE — ED Notes (Signed)
Vital signs stable. 

## 2017-12-16 LAB — CBC
HCT: 39.3 % — ABNORMAL LOW (ref 40.0–52.0)
Hemoglobin: 13.8 g/dL (ref 13.0–18.0)
MCH: 32.3 pg (ref 26.0–34.0)
MCHC: 35 g/dL (ref 32.0–36.0)
MCV: 92.2 fL (ref 80.0–100.0)
Platelets: 148 10*3/uL — ABNORMAL LOW (ref 150–440)
RBC: 4.27 MIL/uL — AB (ref 4.40–5.90)
RDW: 14.8 % — AB (ref 11.5–14.5)
WBC: 12.2 10*3/uL — AB (ref 3.8–10.6)

## 2017-12-16 LAB — BASIC METABOLIC PANEL
Anion gap: 8 (ref 5–15)
BUN: 62 mg/dL — AB (ref 8–23)
CALCIUM: 8.2 mg/dL — AB (ref 8.9–10.3)
CHLORIDE: 112 mmol/L — AB (ref 98–111)
CO2: 23 mmol/L (ref 22–32)
CREATININE: 1.7 mg/dL — AB (ref 0.61–1.24)
GFR, EST AFRICAN AMERICAN: 42 mL/min — AB (ref >60)
GFR, EST NON AFRICAN AMERICAN: 36 mL/min — AB (ref >60)
Glucose, Bld: 97 mg/dL (ref 70–99)
Potassium: 3.6 mmol/L (ref 3.5–5.1)
SODIUM: 143 mmol/L (ref 135–145)

## 2017-12-16 LAB — GLUCOSE, CAPILLARY: Glucose-Capillary: 101 mg/dL — ABNORMAL HIGH (ref 70–99)

## 2017-12-16 LAB — CK: CK TOTAL: 2213 U/L — AB (ref 49–397)

## 2017-12-16 MED ORDER — NICOTINE 14 MG/24HR TD PT24
14.0000 mg | MEDICATED_PATCH | Freq: Every day | TRANSDERMAL | Status: DC
  Administered 2017-12-16 – 2017-12-17 (×2): 14 mg via TRANSDERMAL
  Filled 2017-12-16 (×2): qty 1

## 2017-12-16 MED ORDER — NICOTINE 14 MG/24HR TD PT24: 14.0000 mg | MEDICATED_PATCH | Freq: Every day | TRANSDERMAL | Status: DC

## 2017-12-16 MED ORDER — DIPHENOXYLATE-ATROPINE 2.5-0.025 MG PO TABS: 1.0000 | ORAL_TABLET | Freq: Four times a day (QID) | ORAL | Status: DC | PRN

## 2017-12-16 MED ORDER — LOPERAMIDE HCL 2 MG PO CAPS: 2.0000 mg | ORAL_CAPSULE | Freq: Four times a day (QID) | ORAL | Status: DC | PRN

## 2017-12-16 MED ORDER — TAMSULOSIN HCL 0.4 MG PO CAPS
0.4000 mg | ORAL_CAPSULE | Freq: Every day | ORAL | Status: DC
  Administered 2017-12-16 – 2017-12-18 (×3): 0.4 mg via ORAL
  Filled 2017-12-16 (×3): qty 1

## 2017-12-16 MED ORDER — MICONAZOLE NITRATE POWD: Freq: Two times a day (BID) | Status: DC

## 2017-12-16 MED ORDER — PANTOPRAZOLE SODIUM 40 MG PO TBEC
40.0000 mg | DELAYED_RELEASE_TABLET | Freq: Every day | ORAL | Status: DC
  Administered 2017-12-16 – 2017-12-18 (×2): 40 mg via ORAL
  Filled 2017-12-16 (×2): qty 1

## 2017-12-16 MED ORDER — IPRATROPIUM-ALBUTEROL 0.5-2.5 (3) MG/3ML IN SOLN
3.0000 mL | Freq: Two times a day (BID) | RESPIRATORY_TRACT | Status: DC
  Administered 2017-12-16 – 2017-12-17 (×2): 3 mL via RESPIRATORY_TRACT
  Filled 2017-12-16 (×2): qty 3

## 2017-12-16 NOTE — Progress Notes (Signed)
Physical Therapy Treatment Patient Details Name: DAMARCO KEYSOR MRN: [id number] DOB: 1934-09-10 Today's Date: 12/16/2017    History of Present Illness 83 yo male with onset of fall at home and was in the floor 3 days.  Has wounds from pressure on L Shoulder, L trochanter.  Chronic diarrhea and generalized weakness, sepsis, scrotal skin irritation.  PMHx:  COPD, CVA, chronic diarrhea, L hemiparesis, tobacco use, HTN    PT Comments    Pt agreeable to PT for exercises only. Pt reports general malaise and general pain; no location given. Pt participates with limited exercises demonstrating slow effortful movement and weak isometric contractions throughout lower extremities. Pt given written instructions for lower extremity exercises and instructed to perform several times a day as able. Continue PT to progress strength and range throughout lower extremities and trunk to improve functional mobility.    Follow Up Recommendations  SNF     Equipment Recommendations  None recommended by PT    Recommendations for Other Services       Precautions / Restrictions Precautions Precautions: Fall Restrictions Weight Bearing Restrictions: No    Mobility  Bed Mobility               General bed mobility comments: Not tested; pt declined out of bed  Transfers                    Ambulation/Gait                 Stairs             Wheelchair Mobility    Modified Rankin (Stroke Patients Only)       Balance                                            Cognition Arousal/Alertness: Awake/alert Behavior During Therapy: WFL for tasks assessed/performed Overall Cognitive Status: Within Functional Limits for tasks assessed                                 General Comments: Feels fatigued/general malaise      Exercises General Exercises - Lower Extremity Ankle Circles/Pumps: AROM;Both;10 reps;Supine(limited range) Quad Sets:  Strengthening;Both;10 reps;Supine(weak) Gluteal Sets: Strengthening;Both;10 reps;Supine(trace) Heel Slides: AROM;Both;10 reps;Supine(slow effortful) Hip ABduction/ADduction: Other (comment)(educated on verbally with written instructions)    General Comments        Pertinent Vitals/Pain Pain Assessment: Faces Faces Pain Scale: Hurts even more Pain Intervention(s): Limited activity within patient's tolerance    Home Living                      Prior Function            PT Goals (current goals can now be found in the care plan section) Progress towards PT goals: Progressing toward goals(slowly)    Frequency    Min 2X/week      PT Plan Current plan remains appropriate    Co-evaluation              AM-PAC PT "6 Clicks" Daily Activity  Outcome Measure  Difficulty turning over in bed (including adjusting bedclothes, sheets and blankets)?: Unable Difficulty moving from lying on back to sitting on the side of the bed? : Unable Difficulty sitting down on and standing up from a  chair with arms (e.g., wheelchair, bedside commode, etc,.)?: Unable Help needed moving to and from a bed to chair (including a wheelchair)?: A Lot Help needed walking in hospital room?: Total Help needed climbing 3-5 steps with a railing? : Total 6 Click Score: 7    End of Session   Activity Tolerance: Patient limited by fatigue;Patient limited by pain;Other (comment)(weakness) Patient left: in bed;with call bell/phone within reach;with bed alarm set   PT Visit Diagnosis: Unsteadiness on feet (R26.81);Muscle weakness (generalized) (M62.81);History of falling (Z91.81);Difficulty in walking, not elsewhere classified (R26.2);Pain Pain - Right/Left: Left Pain - part of body: Shoulder;Hip     Time: 1045-1101 PT Time Calculation (min) (ACUTE ONLY): 16 min  Charges:  $Therapeutic Exercise: 8-22 mins                      Larae Grooms, PTA 12/16/2017, 11:54 AM

## 2017-12-16 NOTE — Progress Notes (Signed)
Daughter called and updated no further questions

## 2017-12-16 NOTE — Clinical Social Work Note (Signed)
CSW spoke with patient's daughter Cresencio Reesor [phone] regarding bed offers. Daughter chose [facility]. CSW notified Magda Paganini, admissions coordinator of bed acceptance. CSW will continue to follow for discharge planning.   [place], [place]

## 2017-12-16 NOTE — Progress Notes (Signed)
[facility] [place] NAME: Erik Carey    MR#:  [id number]  DATE OF BIRTH:  [birth year]  SUBJECTIVE:  CHIEF COMPLAINT:   Chief Complaint  Patient presents with  . Fall   Generalized weakness, left thigh pain,  no diarrhea today. REVIEW OF SYSTEMS:  Review of Systems  Constitutional: Positive for malaise/fatigue. Negative for chills and fever.  HENT: Negative for sore throat.   Eyes: Negative for blurred vision and double vision.  Respiratory: Negative for cough, hemoptysis, shortness of breath, wheezing and stridor.   Cardiovascular: Negative for chest pain, palpitations, orthopnea and leg swelling.  Gastrointestinal: Negative for abdominal pain, blood in stool, diarrhea, melena, nausea and vomiting.  Genitourinary: Negative for dysuria, flank pain and hematuria.  Musculoskeletal: Positive for myalgias. Negative for back pain and joint pain.  Skin: Negative for rash.  Neurological: Negative for dizziness, sensory change, focal weakness, seizures, loss of consciousness, weakness and headaches.  Endo/Heme/Allergies: Negative for polydipsia.  Psychiatric/Behavioral: Negative for depression. The patient is not nervous/anxious.     DRUG ALLERGIES:  No Known Allergies VITALS:  Blood pressure 118/69, pulse 85, temperature 98.1 F (36.7 C), temperature source Oral, resp. rate 18, height 5\' 9"  (1.753 m), weight 105.2 kg, SpO2 97 %. PHYSICAL EXAMINATION:  Physical Exam  Constitutional: He is oriented to person, place, and time.  Morbid obesity.  HENT:  Head: Normocephalic.  Mouth/Throat: Oropharynx is clear and moist.  Eyes: Pupils are equal, round, and reactive to light. Conjunctivae and EOM are normal. No scleral icterus.  Neck: Normal range of motion. Neck supple. No JVD present. No tracheal deviation present.  Cardiovascular: Normal rate, regular rhythm and normal heart sounds. Exam reveals no gallop.  No murmur  heard. Pulmonary/Chest: Effort normal and breath sounds normal. No respiratory distress. He has no wheezes. He has no rales.  Abdominal: Soft. Bowel sounds are normal. He exhibits no distension. There is no tenderness. There is no rebound.  Musculoskeletal: He exhibits tenderness. He exhibits no edema.  Tenderness, erythema and bruise on left thigh.  Neurological: He is alert and oriented to person, place, and time. No cranial nerve deficit.  Skin: No rash noted. No erythema.  Psychiatric: He has a normal mood and affect.  Vitals reviewed.  LABORATORY PANEL:  Male CBC Recent Labs  Lab 12/16/17 0414  WBC 12.2*  HGB 13.8  HCT 39.3*  PLT 148*   ------------------------------------------------------------------------------------------------------------------ Chemistries  Recent Labs  Lab 12/15/17 1118 12/16/17 0414  NA 143 143  K 4.2 3.6  CL 105 112*  CO2 22 23  GLUCOSE 108* 97  BUN 60* 62*  CREATININE 1.92* 1.70*  CALCIUM 9.0 8.2*  AST 136*  --   ALT 27  --   ALKPHOS 61  --   BILITOT 2.1*  --    RADIOLOGY:  No results found. ASSESSMENT AND PLAN:   *Left hip cellulitis surrounding pressure ulcer stage 2 and sepsis present on admission Continue Ancef, follow-up CBC and cultures.  Local wound care.  *Acute rhabdomyolysis due to prolonged immobilization and dehydration.  Continue IV fluids.  Repeat CK in the morning.  *Acute kidney injury.  Baseline unknown.  Likely due to rhabdomyolysis and dehydration.  Creatinine 1.92.  Continue IV fluid and follow-up BMP.  *Hypertension.  Hold hypertension medication due to normal blood pressure.  *Generalized weakness due to dehydration.    PT evaluation: SNF. *Chronic diarrhea.  According to patient he has 7-8 loose to watery stools daily.  Has had extensive work-up as outpatient with no cause found.  *Tobacco abuse.  Counseled patient to quit smoking for greater than 3 minutes per  All the records are reviewed and case  discussed with Care Management/Social Worker. Management plans discussed with the patient, family and they are in agreement.  CODE STATUS: DNR  TOTAL TIME TAKING CARE OF THIS PATIENT: 37 minutes.   More than 50% of the time was spent in counseling/coordination of care: YES  POSSIBLE D/C IN 2 DAYS, DEPENDING ON CLINICAL CONDITION.   Demetrios Loll M.D on 12/16/2017 at 1:40 PM  Between 7am to 6pm - Pager - [phone]  After 6pm go to [email or web address] - [facility]

## 2017-12-16 NOTE — Clinical Social Work Note (Signed)
Clinical Social Work Assessment  Patient Details  Name: Erik Carey MRN: [id number] Date of Birth: 11/01/1934  Date of referral:  12/16/17               Reason for consult:  Facility Placement                Permission sought to share information with:  Case Manager, Customer service manager, Family Supports Permission granted to share information::  Yes, Verbal Permission Granted  Name::      SNF  Agency::   [facility]   Relationship::     Contact Information:     Housing/Transportation Living arrangements for the past 2 months:  [place] of Information:  Patient Patient Interpreter Needed:  None Criminal Activity/Legal Involvement Pertinent to Current Situation/Hospitalization:  No - Comment as needed Significant Relationships:  Adult Children Lives with:  Self Do you feel safe going back to the place where you live?  Yes Need for family participation in patient care:  Yes (Comment)  Care giving concerns:  Patient lives alone in [place] Worker assessment / plan:  CSW consulted for SNF placement. CSW met with patient. CSW introduced self and explained role. Patient states that he lives alone but his daughter checks on him most days. Patient states that he has never been to rehab and has not had home health that he remembers. CSW explained PT recommendation of SNF. Patient is in agreement with SNF and would ike to get rehab before returning home. CSW explained bed search process and patient is in agreement. CSW will initiate bed search and give bed offers once received. CSW did give patient SNF list as well. CSW will continue to follow for discharge planning.   Employment status:  Retired Forensic scientist:  [employer] PT Recommendations:  [place] / Referral to community resources:  [place]  Patient/Family's Response to care:  Patient thanked CSW for assistance   Patient/Family's  Understanding of and Emotional Response to Diagnosis, Current Treatment, and Prognosis:  Patient in agreement with SNF   Emotional Assessment Appearance:  Appears stated age Attitude/Demeanor/Rapport:    Affect (typically observed):  Accepting, Pleasant, Hopeful Orientation:  Oriented to Self, Oriented to Place, Oriented to  Time, Oriented to Situation Alcohol / Substance use:  Not Applicable Psych involvement (Current and /or in the community):  No (Comment)  Discharge Needs  Concerns to be addressed:  Discharge Planning Concerns Readmission within the last 30 days:  No Current discharge risk:  None Barriers to Discharge:  Continued Medical Work up   [employer], [place] 12/16/2017, 10:46 AM

## 2017-12-16 NOTE — NC FL2 (Signed)
[place] LEVEL OF CARE SCREENING TOOL     IDENTIFICATION  Patient Name: Erik Carey Birthdate: [birth year] Sex: male Admission Date (Current Location): 12/15/2017  [place] and [place] Number:  Engineering geologist and Address:  [facility], [address], [place], [place] [address]      Provider Number: [id number]  Attending Physician Name and Address:  Demetrios Loll, MD  Relative Name and Phone Number:  Gibson Lad- daughter [phone]    Current Level of Care: Hospital Recommended Level of Care: [place] Prior Approval Number:    Date Approved/Denied:   PASRR Number: [id number]  Discharge Plan: SNF    Current Diagnoses: Patient Active Problem List   Diagnosis Date Noted  . Rhabdomyolysis 12/15/2017    Orientation RESPIRATION BLADDER Height & Weight     Self, Time, Place  Normal Incontinent Weight: 232 lb (105.2 kg) Height:  5\' 9"  (175.3 cm)  BEHAVIORAL SYMPTOMS/MOOD NEUROLOGICAL BOWEL NUTRITION STATUS  (None ) (None) Continent Diet(Regular )  AMBULATORY STATUS COMMUNICATION OF NEEDS Skin   Extensive Assist Verbally Other (Comment)(Stage 2 on left hip )                       Personal Care Assistance Level of Assistance  Feeding, Dressing, Bathing Bathing Assistance: Limited assistance Feeding assistance: Independent Dressing Assistance: Limited assistance     Functional Limitations Info  Sight, Hearing, Speech Sight Info: Adequate Hearing Info: Adequate Speech Info: Adequate    SPECIAL CARE FACTORS FREQUENCY  PT (By licensed PT), OT (By licensed OT)     PT Frequency: 5 OT Frequency: 5            Contractures Contractures Info: Not present    Additional Factors Info  Code Status, Allergies Code Status Info: DNR Allergies Info: NKA           Current Medications (12/16/2017):  This is the current hospital active medication list Current Facility-Administered Medications   Medication Dose Route Frequency Provider Last Rate Last Dose  . 0.9 %  sodium chloride infusion   Intravenous Continuous Hillary Bow, MD 75 mL/hr at 12/16/17 0538    . acetaminophen (TYLENOL) tablet 650 mg  650 mg Oral Q6H PRN Hillary Bow, MD       Or  . acetaminophen (TYLENOL) suppository 650 mg  650 mg Rectal Q6H PRN Sudini, Srikar, MD      . albuterol (PROVENTIL) (2.5 MG/3ML) 0.083% nebulizer solution 2.5 mg  2.5 mg Nebulization Q2H PRN Sudini, Srikar, MD      . ceFAZolin (ANCEF) IVPB 1 g/50 mL premix  1 g Intravenous Q8H Sudini, Srikar, MD 100 mL/hr at 12/16/17 0538 1 g at 12/16/17 0538  . collagenase (SANTYL) ointment   Topical Daily Delman Kitten, MD      . enoxaparin (LOVENOX) injection 40 mg  40 mg Subcutaneous Q24H Hillary Bow, MD   40 mg at 12/15/17 1637  . HYDROcodone-acetaminophen (NORCO/VICODIN) 5-325 MG per tablet 1-2 tablet  1-2 tablet Oral Q4H PRN Hillary Bow, MD   1 tablet at 12/15/17 1636  . ipratropium-albuterol (DUONEB) 0.5-2.5 (3) MG/3ML nebulizer solution 3 mL  3 mL Nebulization Q6H Sudini, Srikar, MD   3 mL at 12/16/17 0753  . ondansetron (ZOFRAN) tablet 4 mg  4 mg Oral Q6H PRN Sudini, Alveta Heimlich, MD       Or  . ondansetron (ZOFRAN) injection 4 mg  4 mg Intravenous Q6H PRN Hillary Bow, MD      .  polyethylene glycol (MIRALAX / GLYCOLAX) packet 17 g  17 g Oral Daily PRN Hillary Bow, MD         Discharge Medications: Please see discharge summary for a list of discharge medications.  Relevant Imaging Results:  Relevant Lab Results:   Additional Information SSN: [id number]  Annamaria Boots, [place]

## 2017-12-16 NOTE — Clinical Social Work Placement (Addendum)
   CLINICAL SOCIAL WORK PLACEMENT  NOTE  Date:  12/16/2017  Patient Details  Name: Erik Carey MRN: [id number] Date of Birth: Apr 20, 1935  Clinical Social Work is seeking post-discharge placement for this patient at the [place] level of care (*CSW will initial, date and re-position this form in  chart as items are completed):  Yes   Patient/family provided with [facility] Work Department's list of facilities offering this level of care within the geographic area requested by the patient (or if unable, by the patient's family).  Yes   Patient/family informed of their freedom to choose among providers that offer the needed level of care, that participate in Medicare, Medicaid or managed care program needed by the patient, have an available bed and are willing to accept the patient.  Yes   Patient/family informed of [facility]'s ownership interest in [facility] and [facility], as well as of the fact that they are under no obligation to receive care at these facilities.  PASRR submitted to EDS on 12/16/17     PASRR number received on 12/16/17     Existing PASRR number confirmed on       FL2 transmitted to all facilities in geographic area requested by pt/family on 12/16/17     FL2 transmitted to all facilities within larger geographic area on       Patient informed that his/her managed care company has contracts with or will negotiate with certain facilities, including the following:         12-17-17   Patient/family informed of bed offers received.  Patient chooses bed at  [facility]     Physician recommends and patient chooses bed at      Patient to be transferred to   on  .  Patient to be transferred to facility by       Patient family notified on   of transfer.  Name of family member notified:        PHYSICIAN       Additional Comment:    _______________________________________________ Annamaria Boots,  [place] 12/16/2017, 10:49 AM   Updated Jones Broom. [place], MSW, [place]  12/17/2017 5:08 PM

## 2017-12-16 NOTE — Progress Notes (Signed)
SWOT assist with patient care. Patient reports pain 10/10 to left hip. Medication administered and repositioning provided. Patient with redness noted to scrotum. Antifungal powder applied. New order to apply antifungal powder with pericare and q shift. Patient with inspiratory and expiratory wheezing confirms hx of COPD with no home O2. Albuterol administered for wheezing. Patient now resting quietly in bed.

## 2017-12-17 DIAGNOSIS — L8990 Pressure ulcer of unspecified site, unspecified stage: Secondary | ICD-10-CM

## 2017-12-17 LAB — BASIC METABOLIC PANEL
Anion gap: 5 (ref 5–15)
BUN: 39 mg/dL — AB (ref 8–23)
CHLORIDE: 111 mmol/L (ref 98–111)
CO2: 26 mmol/L (ref 22–32)
Calcium: 8.1 mg/dL — ABNORMAL LOW (ref 8.9–10.3)
Creatinine, Ser: 1.32 mg/dL — ABNORMAL HIGH (ref 0.61–1.24)
GFR calc Af Amer: 57 mL/min — ABNORMAL LOW (ref >60)
GFR calc non Af Amer: 49 mL/min — ABNORMAL LOW (ref >60)
GLUCOSE: 103 mg/dL — AB (ref 70–99)
POTASSIUM: 3.7 mmol/L (ref 3.5–5.1)
Sodium: 142 mmol/L (ref 135–145)

## 2017-12-17 LAB — CK: Total CK: 1164 U/L — ABNORMAL HIGH (ref 49–397)

## 2017-12-17 MED ORDER — PREDNISONE 50 MG PO TABS
50.0000 mg | ORAL_TABLET | Freq: Every day | ORAL | Status: DC
  Administered 2017-12-17 – 2017-12-18 (×2): 50 mg via ORAL
  Filled 2017-12-17 (×2): qty 1

## 2017-12-17 MED ORDER — ONDANSETRON 4 MG PO TBDP
4.0000 mg | ORAL_TABLET | Freq: Once | ORAL | Status: AC
  Administered 2017-12-17: 4 mg via ORAL
  Filled 2017-12-17: qty 1

## 2017-12-17 MED ORDER — IPRATROPIUM-ALBUTEROL 0.5-2.5 (3) MG/3ML IN SOLN
3.0000 mL | Freq: Four times a day (QID) | RESPIRATORY_TRACT | Status: DC
  Administered 2017-12-17 – 2017-12-18 (×4): 3 mL via RESPIRATORY_TRACT
  Filled 2017-12-17 (×5): qty 3

## 2017-12-17 MED ORDER — ONDANSETRON 4 MG PO TBDP
4.0000 mg | ORAL_TABLET | Freq: Three times a day (TID) | ORAL | Status: DC | PRN
  Filled 2017-12-17: qty 1

## 2017-12-17 NOTE — Progress Notes (Signed)
Physical Therapy Treatment Patient Details Name: Erik Carey MRN: [id number] DOB: March 28, 1935 Today's Date: 12/17/2017    History of Present Illness 83 yo male with onset of fall at home and was in the floor 3 days.  Has wounds from pressure on L Shoulder, L trochanter.  Chronic diarrhea and generalized weakness, sepsis, scrotal skin irritation.  PMHx:  COPD, CVA, chronic diarrhea, L hemiparesis, tobacco use, HTN    PT Comments    Pt agreeable to PT; reports no pain today and feeling better overall. Pt able to participate better with BLE exercises today requiring cues/instruction on breathing appropriately and managing work/breaks to allow for improved performance and managing shortness of breath. Pt demonstrating improved range and strength throughout. Pt notes he has not been an ambulator in the last year (uses w/c), but does transfer. Pt declines out of bed at this time (is also currently awaiting IV team to start a new line). Continue PT to progress strength, endurance to improve all functional mobility.    Follow Up Recommendations        Equipment Recommendations       Recommendations for Other Services       Precautions / Restrictions Precautions Precautions: Fall Restrictions Weight Bearing Restrictions: No    Mobility  Bed Mobility               General bed mobility comments: Not tested; pt declines out of bed. Pt also having difficulty with IV site and awaiting IV team to start a new line.   Transfers                    Ambulation/Gait                 Stairs             Wheelchair Mobility    Modified Rankin (Stroke Patients Only)       Balance                                            Cognition Arousal/Alertness: Awake/alert Behavior During Therapy: WFL for tasks assessed/performed Overall Cognitive Status: Within Functional Limits for tasks assessed                                         Exercises General Exercises - Lower Extremity Ankle Circles/Pumps: AROM;Both;15 reps;Supine Quad Sets: Strengthening;Both;15 reps;Supine Gluteal Sets: Strengthening;Both;15 reps;Supine Short Arc Quad: AROM;Both;10 reps;Supine Heel Slides: AROM;Both;5 reps;Supine(2 sets) Hip ABduction/ADduction: AROM;Both;10 reps;Supine Straight Leg Raises: AAROM;Both;10 reps;Supine    General Comments        Pertinent Vitals/Pain Pain Assessment: No/denies pain    Home Living                      Prior Function            PT Goals (current goals can now be found in the care plan section) Progress towards PT goals: Progressing toward goals    Frequency    Min 2X/week      PT Plan Current plan remains appropriate    Co-evaluation              AM-PAC PT "6 Clicks" Daily Activity  Outcome Measure  Difficulty turning over in bed (including  adjusting bedclothes, sheets and blankets)?: Unable Difficulty moving from lying on back to sitting on the side of the bed? : Unable Difficulty sitting down on and standing up from a chair with arms (e.g., wheelchair, bedside commode, etc,.)?: Unable Help needed moving to and from a bed to chair (including a wheelchair)?: A Lot Help needed walking in hospital room?: Total Help needed climbing 3-5 steps with a railing? : Total 6 Click Score: 7    End of Session   Activity Tolerance: Patient tolerated treatment well Patient left: in bed;with call bell/phone within reach;with bed alarm set   PT Visit Diagnosis: Unsteadiness on feet (R26.81);Muscle weakness (generalized) (M62.81);History of falling (Z91.81);Difficulty in walking, not elsewhere classified (R26.2);Pain Pain - Right/Left: Left Pain - part of body: Shoulder;Hip     Time: 8979-1504 PT Time Calculation (min) (ACUTE ONLY): 26 min  Charges:  $Therapeutic Exercise: 23-37 mins                      Larae Grooms, PTA 12/17/2017, 1:19 PM

## 2017-12-17 NOTE — Clinical Social Work Note (Addendum)
CSW spoke with patient and he is aware that if he is medically ready for discharge and orders have been received that he will go to [facility] on Thursday.  CSW to facilitate discharge planning, [facility] aware and can accept patient on Thursday if he is medically ready per physician.  Jones Broom. [place], MSW, [place]  12/17/2017 5:06 PM

## 2017-12-17 NOTE — Plan of Care (Signed)
Pt experienced nausea vomiting earlier. Resolved at current time. Pt made NPO for speech consult.  Daughter updated via phone call to changes in medication, plan of care and discharge planning. No further questions at end of call.

## 2017-12-17 NOTE — Progress Notes (Signed)
[facility] [place] NAME: Erik Carey    MR#:  [id number]  DATE OF BIRTH:  Jul 11, 1934  SUBJECTIVE:  CHIEF COMPLAINT:   Chief Complaint  Patient presents with  . Fall   Generalized weakness. Wheezing. No further diarrhea REVIEW OF SYSTEMS:  Review of Systems  Constitutional: Positive for malaise/fatigue. Negative for chills and fever.  HENT: Negative for sore throat.   Eyes: Negative for blurred vision and double vision.  Respiratory: Negative for cough, hemoptysis, shortness of breath, wheezing and stridor.   Cardiovascular: Negative for chest pain, palpitations, orthopnea and leg swelling.  Gastrointestinal: Negative for abdominal pain, blood in stool, diarrhea, melena, nausea and vomiting.  Genitourinary: Negative for dysuria, flank pain and hematuria.  Musculoskeletal: Positive for myalgias. Negative for back pain and joint pain.  Skin: Negative for rash.  Neurological: Negative for dizziness, sensory change, focal weakness, seizures, loss of consciousness, weakness and headaches.  Endo/Heme/Allergies: Negative for polydipsia.  Psychiatric/Behavioral: Negative for depression. The patient is not nervous/anxious.     DRUG ALLERGIES:  No Known Allergies VITALS:  Blood pressure 139/75, pulse 97, temperature 97.6 F (36.4 C), temperature source Oral, resp. rate 18, height 5\' 9"  (1.753 m), weight 105.2 kg, SpO2 98 %. PHYSICAL EXAMINATION:  Physical Exam  Constitutional: He is oriented to person, place, and time.  Morbid obesity.  HENT:  Head: Normocephalic.  Mouth/Throat: Oropharynx is clear and moist.  Eyes: Pupils are equal, round, and reactive to light. Conjunctivae and EOM are normal. No scleral icterus.  Neck: Normal range of motion. Neck supple. No JVD present. No tracheal deviation present.  Cardiovascular: Normal rate, regular rhythm and normal heart sounds. Exam reveals no gallop.  No murmur heard. Pulmonary/Chest:  No respiratory distress. He has wheezes. He has no rales.  Abdominal: Soft. Bowel sounds are normal. He exhibits no distension. There is no tenderness. There is no rebound.  Musculoskeletal: He exhibits tenderness. He exhibits no edema.  Tenderness, erythema and bruise on left thigh.  Neurological: He is alert and oriented to person, place, and time. No cranial nerve deficit.  Skin: No rash noted. No erythema.  Psychiatric: He has a normal mood and affect.  Vitals reviewed.  LABORATORY PANEL:  Male CBC Recent Labs  Lab 12/16/17 0414  WBC 12.2*  HGB 13.8  HCT 39.3*  PLT 148*   ------------------------------------------------------------------------------------------------------------------ Chemistries  Recent Labs  Lab 12/15/17 1118  12/17/17 0451  NA 143   < > 142  K 4.2   < > 3.7  CL 105   < > 111  CO2 22   < > 26  GLUCOSE 108*   < > 103*  BUN 60*   < > 39*  CREATININE 1.92*   < > 1.32*  CALCIUM 9.0   < > 8.1*  AST 136*  --   --   ALT 27  --   --   ALKPHOS 61  --   --   BILITOT 2.1*  --   --    < > = values in this interval not displayed.   RADIOLOGY:  No results found. ASSESSMENT AND PLAN:   * COPD exacerbation Start prednisone Change duoneb to Q6 hr scheduled  * Left hip cellulitis surrounding pressure ulcer stage 2 and sepsis present on admission Continue Ancef,  Switch to keflex tomorrow  *Acute rhabdomyolysis due to prolonged immobilization and dehydration.  Improved. Stop IVF  *Acute kidney injury.  Baseline unknown.  Likely due to  rhabdomyolysis and dehydration.   Creatinine improving Stop IVF  *Hypertension.  Held hypertension medication. Normal  *Generalized weakness due to dehydration.    PT evaluation: SNF.  *Chronic diarrhea.  According to patient he has 7-8 loose to watery stools daily.  Has had extensive work-up as outpatient with no cause found. No diarrhea here.  All the records are reviewed and case discussed with Care  Management/Social Worker. Management plans discussed with the patient, family and they are in agreement.  CODE STATUS: DNR  TOTAL TIME TAKING CARE OF THIS PATIENT: 30 minutes.   POSSIBLE D/C IN 1-2 DAYS, DEPENDING ON CLINICAL CONDITION.  Neita Carp M.D on 12/17/2017 at 12:33 PM  Between 7am to 6pm - Pager - [phone]  After 6pm go to [email or web address] - [facility]

## 2017-12-17 NOTE — Progress Notes (Signed)
Patient complains of constipation. Declined PRN medication stating he "perfers to go by myself when can, that stuff gets me going and it wont stop."

## 2017-12-17 NOTE — Progress Notes (Signed)
Plan is for patient to D/C to [facility] tomorrow pending medical clearance. Patient's daughter [name] is aware of above. [facility] admissions coordinator at [facility] is aware of above.   [employer], LCSW [phone]

## 2017-12-18 LAB — BASIC METABOLIC PANEL
Anion gap: 7 (ref 5–15)
BUN: 31 mg/dL — AB (ref 8–23)
CHLORIDE: 112 mmol/L — AB (ref 98–111)
CO2: 25 mmol/L (ref 22–32)
CREATININE: 1.33 mg/dL — AB (ref 0.61–1.24)
Calcium: 8.4 mg/dL — ABNORMAL LOW (ref 8.9–10.3)
GFR calc Af Amer: 56 mL/min — ABNORMAL LOW (ref >60)
GFR calc non Af Amer: 48 mL/min — ABNORMAL LOW (ref >60)
GLUCOSE: 124 mg/dL — AB (ref 70–99)
Potassium: 4.3 mmol/L (ref 3.5–5.1)
Sodium: 144 mmol/L (ref 135–145)

## 2017-12-18 LAB — CK: Total CK: 406 U/L — ABNORMAL HIGH (ref 49–397)

## 2017-12-18 MED ORDER — METOPROLOL TARTRATE 25 MG PO TABS
25.0000 mg | ORAL_TABLET | Freq: Two times a day (BID) | ORAL | Status: DC
  Administered 2017-12-18: 25 mg via ORAL
  Filled 2017-12-18: qty 1

## 2017-12-18 MED ORDER — CEPHALEXIN 250 MG PO CAPS: 250.0000 mg | ORAL_CAPSULE | Freq: Three times a day (TID) | ORAL | 0 refills | Status: AC

## 2017-12-18 MED ORDER — HYDROCHLOROTHIAZIDE 25 MG PO TABS
25.0000 mg | ORAL_TABLET | Freq: Every day | ORAL | Status: DC
  Administered 2017-12-18: 16:00:00 25 mg via ORAL
  Filled 2017-12-18: qty 1

## 2017-12-18 MED ORDER — LOPERAMIDE HCL 2 MG PO CAPS: 2.0000 mg | ORAL_CAPSULE | Freq: Four times a day (QID) | ORAL | 0 refills | Status: DC | PRN

## 2017-12-18 MED ORDER — CALCIUM CARBONATE ANTACID 500 MG PO CHEW: 1.0000 | CHEWABLE_TABLET | Freq: Three times a day (TID) | ORAL | Status: DC | PRN

## 2017-12-18 MED ORDER — PREDNISONE 50 MG PO TABS: 50.0000 mg | ORAL_TABLET | Freq: Every day | ORAL | 0 refills | Status: DC

## 2017-12-18 NOTE — Evaluation (Signed)
Clinical/Bedside Swallow Evaluation Patient Details  Name: Erik Carey MRN: [id number] Date of Birth: 1935-01-08  Today's Date: 12/18/2017 Time: SLP Start Time (ACUTE ONLY): 0825 SLP Stop Time (ACUTE ONLY): 0925 SLP Time Calculation (min) (ACUTE ONLY): 60 min  Past Medical History:  Past Medical History:  Diagnosis Date  . COPD (chronic obstructive pulmonary disease) ([place])   . CVA (cerebral vascular accident) ([place])   . Hypertension   . Left-sided weakness   . Slurred speech   . Tobacco use    Past Surgical History: History reviewed. No pertinent surgical history. HPI:  Pt is a 83 y.o. male with a known history of Reflux, hypertension, COPD, CVA, chronic left weakness, Tobacco use chronic diarrhea presents to the emergency room brought in by the daughter after he has been laying on the floor for 3 days with urine and stool around him.  Patient does have chronic diarrhea which had been extensively worked up in the past by PCP with no cause found.  He could not get up after a fall.  At baseline he ambulates with a walker or holding onto things at home.  He complains of some left hip pain.  An x-ray has been done with no fractures.  He does have multiple decubitus pressure ulcers.  Surrounding erythema and some pain on the left hip wound.  Pt verbally conversive and followed all instruction.  Noted min Left sided weakness; speech c/b decreased articulation however pt is Edentulous which impacts speech articulation/precision.    Assessment / Plan / Recommendation Clinical Impression  Pt appears to present w/ adequate oropharyngeal phase swallow function w/ reduced risk for aspiration when following general aspiration precautions including SMALL sips of liquids SLOWLY. Pt also has a baseline of REFLUX and potential Esophageal dysmotility per his description and would benefit from following REFLUX precautions especially when eating Meats/Breads - he is also EDENTULOUS and cannot fully masticate  such foods. Pt consumed trials of thin liquids via CUP, purees, and soft solids w/ no overt s/s of aspiration noted except 1x when he took too large of a sip. No further s/s noted when taking smaller sips, slowly. No decline in vocal quality or respiratory status during oral intake. Oral phase grossly WFL for pt but increased oral phase time was noted w/ soft solids d/t his EDENTULOUS status. Oral clearing was appropriate b/t trials. Pt fed self given setup. He needed positioning UPRIGHT assistance for safe oral intake. Recommend a Mech soft diet d/t EDENTULOUS status; thin liquids VIA CUP - NO STRAWS. General aspiration precautions; PILLS WHOLE IN PUREE. Setup support at meals. No further skilled ST Services indicated as pt appears at his baseline. NSG to reconsult if any decline during this admission. Pt agreed. Of note, pt reported baseline of REFLUX (w/ overt s/s of Globus feelings and belching), and he would benefit from GI f/u for full assessment and management; PPI possibly as pt reported using "Tums and Rolaids at home".  SLP Visit Diagnosis: Dysphagia, oral phase (R13.11);Dysphagia, unspecified (R13.10)(Edentulous at baseline; Esophageal - REFLUX)    Aspiration Risk  (reduced following precautions)    Diet Recommendation  Mech Soft diet (dys. Level 3) d/t Edentulous status; Thin liquids - NO STRAWS. General aspiration precautions. REFLUX precautions. Tray setup at meals  Medication Administration: Whole meds with puree(for safer swallowing)    Other  Recommendations Recommended Consults: Consider GI evaluation;Consider esophageal assessment(Dietician) Oral Care Recommendations: Oral care BID;Patient independent with oral care;Staff/trained caregiver to provide oral care Other Recommendations: (n/a)  Follow up Recommendations None      Frequency and Duration (n/a)  (n/a)       Prognosis Prognosis for Safe Diet Advancement: Fair(-Good) Barriers to Reach Goals: (baseline REFLUX per pt;  edentulous)      Swallow Study   General Date of Onset: 12/15/17 HPI: Pt is a 83 y.o. male with a known history of Reflux, hypertension, COPD, CVA, chronic left weakness, Tobacco use chronic diarrhea presents to the emergency room brought in by the daughter after he has been laying on the floor for 3 days with urine and stool around him.  Patient does have chronic diarrhea which had been extensively worked up in the past by PCP with no cause found.  He could not get up after a fall.  At baseline he ambulates with a walker or holding onto things at home.  He complains of some left hip pain.  An x-ray has been done with no fractures.  He does have multiple decubitus pressure ulcers.  Surrounding erythema and some pain on the left hip wound.  Pt verbally conversive and followed all instruction.  Noted min Left sided weakness; speech c/b decreased articulation however pt is Edentulous which impacts speech articulation/precision.  Type of Study: Bedside Swallow Evaluation Previous Swallow Assessment: none reported Diet Prior to this Study: NPO(has been on a regular diet at home/initially admitted) Temperature Spikes Noted: No(wbc 12.2 down from 21.3) Respiratory Status: Room air History of Recent Intubation: No Behavior/Cognition: Alert;Cooperative;Pleasant mood(Edentulous) Oral Cavity Assessment: Within Functional Limits Oral Care Completed by SLP: Recent completion by staff Oral Cavity - Dentition: Edentulous Vision: Functional for self-feeding Self-Feeding Abilities: Able to feed self;Needs set up(positioning upright) Patient Positioning: Upright in bed Baseline Vocal Quality: Normal Volitional Cough: Strong Volitional Swallow: Able to elicit    Oral/Motor/Sensory Function Overall Oral Motor/Sensory Function: Within functional limits   Ice Chips Ice chips: Within functional limits Presentation: Spoon(fed; 3 trials)   Thin Liquid Thin Liquid: Within functional limits(grossly except 1x w/  larger sip) Presentation: Cup;Self Fed(no straws; ~6 ozs)    Nectar Thick Nectar Thick Liquid: Not tested   Honey Thick Honey Thick Liquid: Not tested   Puree Puree: Within functional limits Presentation: Self Fed;Spoon(4 ozs)   Solid     Solid: Impaired Presentation: Self Fed;Spoon(2 pancakes) Oral Phase Impairments: Impaired mastication(edentulous status) Oral Phase Functional Implications: Impaired mastication(increased time) Pharyngeal Phase Impairments: (none)      Orinda Kenner, MS, CCC-SLP Shani Fitch 12/18/2017,11:03 AM

## 2017-12-18 NOTE — Progress Notes (Signed)
Report called to [facility]. Spoke with assigned RN provided report. No questions remain at close of call. Requested EMS transport. Unable at this point to reach daughter. Whom stated on 12/18/2016 she would be working today on 12/18/2017. Following MD attempt and RN attempt. Left Message to Call Alvenia Treese M,RN at unit phone number [phone].

## 2017-12-18 NOTE — Care Management Important Message (Signed)
Important Message  Patient Details  Name: Erik Carey MRN: [id number] Date of Birth: 1934-05-29   Medicare Important Message Given:  Yes    Juliann Pulse A Asjia Berrios 12/18/2017, 11:09 AM

## 2017-12-18 NOTE — Discharge Summary (Signed)
[facility] NAME: Erik Carey    MR#:  [id number]  DATE OF BIRTH:  1934-07-25  DATE OF ADMISSION:  12/15/2017 ADMITTING PHYSICIAN: Hillary Bow, MD  DATE OF DISCHARGE: 12/18/2017  PRIMARY CARE PHYSICIAN: Raelyn Mora, MD   ADMISSION DIAGNOSIS:  Intertrigo [L30.4] AKI (acute kidney injury) ([place]) [N17.9] Pressure sore of hip, left, unstageable (HCC) [L89.220] Non-traumatic rhabdomyolysis [M62.82]  DISCHARGE DIAGNOSIS:  Active Problems:   Rhabdomyolysis   Pressure injury of skin   SECONDARY DIAGNOSIS:   Past Medical History:  Diagnosis Date  . COPD (chronic obstructive pulmonary disease) ([place])   . CVA (cerebral vascular accident) ([place])   . Hypertension   . Left-sided weakness   . Slurred speech   . Tobacco use      ADMITTING HISTORY  HISTORY OF PRESENT ILLNESS:  Erik Carey  is a 83 y.o. male with a known history of hypertension, COPD, CVA, chronic left weakness, chronic diarrhea presents to the emergency room brought in by the daughter after he has been laying on the floor for 3 days with urine and stool around him.  Patient does have chronic diarrhea which had been extensively worked up in the past by PCP with no cause found.  He could not get up after a fall.  At baseline he ambulates with a walker or holding onto things at home.  He complains of some left hip pain.  An x-ray has been done with no fractures.  He does have multiple decubitus pressure ulcers.  Surrounding erythema and some pain on the left hip wound.  No purulent discharge.  Found to have WBC 21,000 with heart rate 97.  Sepsis present on admission.   HOSPITAL COURSE:   * COPD exacerbation Started prednisone Duoneb in hospital. Albuterol inhlaer on discharge. Prednisone for 4 more days and stop. Has chronci wheezing  * Left hip cellulitis surrounding pressure ulcer stage 2 and sepsis present on admission IV Ancef being changed to keflex for 4  days after discharge   *Acute rhabdomyolysis due to prolonged immobilization and dehydration.  Resolved with IVF  *Acute kidney injury. Likely due to rhabdomyolysis and dehydration.  Resolved  *Hypertension. Held on admission. restart  *Generalized weakness due to dehydration.   PT evaluation: SNF.  *Chronic diarrhea. According to patient he has 7-8 loose to watery stools daily. Has had extensive work-up as outpatient with no cause found. No diarrhea here.  Stable for d/c to SNF  CONSULTS OBTAINED:    DRUG ALLERGIES:  No Known Allergies  DISCHARGE MEDICATIONS:   Allergies as of 12/18/2017   No Known Allergies     Medication List    TAKE these medications   albuterol 108 (90 Base) MCG/ACT inhaler Commonly known as:  PROVENTIL HFA;VENTOLIN HFA Inhale 2 puffs into the lungs every 6 (six) hours as needed for wheezing or shortness of breath.   allopurinol 100 MG tablet Commonly known as:  ZYLOPRIM Take 400 mg by mouth daily.   cephALEXin 250 MG capsule Commonly known as:  KEFLEX Take 1 capsule (250 mg total) by mouth 3 (three) times daily for 4 days.   diphenoxylate-atropine 2.5-0.025 MG tablet Commonly known as:  LOMOTIL Take by mouth 4 (four) times daily as needed for diarrhea or loose stools.   hydrochlorothiazide 25 MG tablet Commonly known as:  HYDRODIURIL Take 25 mg by mouth daily.   lisinopril 40 MG tablet Commonly known as:  PRINIVIL,ZESTRIL Take 40 mg by mouth daily.  loperamide 2 MG capsule Commonly known as:  IMODIUM Take 1 capsule (2 mg total) by mouth every 6 (six) hours as needed for diarrhea or loose stools.   metoprolol tartrate 50 MG tablet Commonly known as:  LOPRESSOR Take 25 mg by mouth 2 (two) times daily.   pantoprazole 40 MG tablet Commonly known as:  PROTONIX Take 40 mg by mouth at bedtime.   predniSONE 50 MG tablet Commonly known as:  DELTASONE Take 1 tablet (50 mg total) by mouth daily with breakfast.   tamsulosin  0.4 MG Caps capsule Commonly known as:  FLOMAX Take 0.4 mg by mouth daily.       Today   VITAL SIGNS:  Blood pressure (!) 176/82, pulse 72, temperature 97.7 F (36.5 C), temperature source Oral, resp. rate 17, height 5\' 9"  (1.753 m), weight 105.6 kg, SpO2 93 %.  I/O:    Intake/Output Summary (Last 24 hours) at 12/18/2017 1027 Last data filed at 12/18/2017 0900 Gross per 24 hour  Intake 360 ml  Output 300 ml  Net 60 ml    PHYSICAL EXAMINATION:  Physical Exam  GENERAL:  83 y.o.-year-old patient lying in the bed with no acute distress.  LUNGS: Normal breath sounds bilaterally, no wheezing, rales,rhonchi or crepitation. No use of accessory muscles of respiration.  CARDIOVASCULAR: S1, S2 normal. No murmurs, rubs, or gallops.  ABDOMEN: Soft, non-tender, non-distended. Bowel sounds present. No organomegaly or mass.  NEUROLOGIC: Moves all 4 extremities. Slurred speech. Left weakness PSYCHIATRIC: The patient is alert and oriented x 3.  SKIN: Pressure ulcers  DATA REVIEW:   CBC Recent Labs  Lab 12/16/17 0414  WBC 12.2*  HGB 13.8  HCT 39.3*  PLT 148*    Chemistries  Recent Labs  Lab 12/15/17 1118  12/18/17 0427  NA 143   < > 144  K 4.2   < > 4.3  CL 105   < > 112*  CO2 22   < > 25  GLUCOSE 108*   < > 124*  BUN 60*   < > 31*  CREATININE 1.92*   < > 1.33*  CALCIUM 9.0   < > 8.4*  AST 136*  --   --   ALT 27  --   --   ALKPHOS 61  --   --   BILITOT 2.1*  --   --    < > = values in this interval not displayed.    Cardiac Enzymes No results for input(s): TROPONINI in the last 168 hours.  Microbiology Results  Results for orders placed or performed during the hospital encounter of 12/15/17  Blood Culture (routine x 2)     Status: None (Preliminary result)   Collection Time: 12/15/17 12:10 PM  Result Value Ref Range Status   Specimen Description BLOOD BLOOD RIGHT ARM  Final   Special Requests   Final    BOTTLES DRAWN AEROBIC AND ANAEROBIC Blood Culture  adequate volume   Culture   Final    NO GROWTH 3 DAYS Performed at [facility], [address] [place], [place] [address]    Report Status PENDING  Incomplete  Blood Culture (routine x 2)     Status: None (Preliminary result)   Collection Time: 12/15/17 12:12 PM  Result Value Ref Range Status   Specimen Description BLOOD RIGHT UPP  Final   Special Requests   Final    BOTTLES DRAWN AEROBIC AND ANAEROBIC Blood Culture adequate volume   Culture   Final  NO GROWTH 3 DAYS Performed at [facility], [address] [place], [place] [address]    Report Status PENDING  Incomplete    RADIOLOGY:  No results found.  Follow up with PCP in 1 week.  Management plans discussed with the patient, family and they are in agreement.  CODE STATUS:     Code Status Orders  (From admission, onward)         Start     Ordered   12/15/17 1418  Do not attempt resuscitation (DNR)  Continuous    Question Answer Comment  In the event of cardiac or respiratory ARREST Do not call a "code blue"   In the event of cardiac or respiratory ARREST Do not perform Intubation, CPR, defibrillation or ACLS   In the event of cardiac or respiratory ARREST Use medication by any route, position, wound care, and other measures to relive pain and suffering. May use oxygen, suction and manual treatment of airway obstruction as needed for comfort.      12/15/17 1419        Code Status History    This patient has a current code status but no historical code status.    Advance Directive Documentation     Most Recent Value  Type of Advance Directive  Healthcare Power of Attorney, Living will  Pre-existing out of facility DNR order (yellow form or pink MOST form)  -  "MOST" Form in Place?  -      TOTAL TIME TAKING CARE OF THIS PATIENT ON DAY OF DISCHARGE: more than 30 minutes.   Neita Carp M.D on 12/18/2017 at 10:27 AM  Between 7am to 6pm - Pager - [phone]  After 6pm go to  [email or web address] - password [email or web address] [facility]  Office  [phone]  CC: Primary care physician; Raelyn Mora, MD  Note: This dictation was prepared with Dragon dictation along with smaller phrase technology. Any transcriptional errors that result from this process are unintentional.

## 2017-12-18 NOTE — Progress Notes (Signed)
Patient is medically stable for D/C to [facility] today. Per [facility] admissions coordinator at [facility] patient can come today to room 407. RN will call report and arrange EMS for transport. Clinical Education officer, museum (CSW) sent D/C orders to [facility] via [place]. Patient is aware of above. CSW contacted patient's daughter [name] and made her aware of above. Please reconsult if future social work needs arise. CSW signing off.   [employer], LCSW [phone]

## 2017-12-18 NOTE — Care Management Important Message (Signed)
Important Message  Patient Details  Name: Erik Carey MRN: [id number] Date of Birth: 15-Jul-1934   Medicare Important Message Given:       Juliann Pulse A Dejon Jungman 12/18/2017, 11:08 AM

## 2017-12-18 NOTE — Discharge Instructions (Signed)
Mechanical soft diet. Thin liquids

## 2017-12-18 NOTE — Clinical Social Work Placement (Signed)
   CLINICAL SOCIAL WORK PLACEMENT  NOTE  Date:  12/18/2017  Patient Details  Name: Erik Carey MRN: [id number] Date of Birth: [birth year]  Clinical Social Work is seeking post-discharge placement for this patient at the [place] level of care (*CSW will initial, date and re-position this form in  chart as items are completed):  Yes   Patient/family provided with [facility] Work Department's list of facilities offering this level of care within the geographic area requested by the patient (or if unable, by the patient's family).  Yes   Patient/family informed of their freedom to choose among providers that offer the needed level of care, that participate in Medicare, Medicaid or managed care program needed by the patient, have an available bed and are willing to accept the patient.  Yes   Patient/family informed of [facility]'s ownership interest in [facility] and [facility], as well as of the fact that they are under no obligation to receive care at these facilities.  PASRR submitted to EDS on 12/16/17     PASRR number received on 12/16/17     Existing PASRR number confirmed on       FL2 transmitted to all facilities in geographic area requested by pt/family on 12/16/17     FL2 transmitted to all facilities within larger geographic area on       Patient informed that his/her managed care company has contracts with or will negotiate with certain facilities, including the following:        Yes   Patient/family informed of bed offers received.  Patient chooses bed at [facility] )     Physician recommends and patient chooses bed at      Patient to be transferred to [facility] ) on 12/18/17.  Patient to be transferred to facility by [facility] )     Patient family notified on 12/18/17 of transfer.  Name of family member notified:  (Patient's daughter [name] is aware of D/C today. )     PHYSICIAN        Additional Comment:    _______________________________________________ Atilano Covelli, Veronia Beets, LCSW 12/18/2017, 11:57 AM

## 2017-12-18 NOTE — Care Management Important Message (Signed)
Important Message  Patient Details  Name: Erik Carey MRN: [id number] Date of Birth: 04-27-1935   Medicare Important Message Given:       Juliann Pulse A Jhalil Silvera 12/18/2017, 10:50 AM

## 2017-12-20 LAB — CULTURE, BLOOD (ROUTINE X 2)
CULTURE: NO GROWTH
Culture: NO GROWTH
SPECIAL REQUESTS: ADEQUATE
Special Requests: ADEQUATE

## 2018-01-29 ENCOUNTER — Ambulatory Visit: Payer: Medicare Other | Admitting: Physician Assistant

## 2018-03-01 ENCOUNTER — Other Ambulatory Visit: Payer: Self-pay

## 2018-03-01 ENCOUNTER — Emergency Department: Payer: Medicare Other

## 2018-03-01 ENCOUNTER — Inpatient Hospital Stay
Admission: EM | Admit: 2018-03-01 | Discharge: 2018-03-08 | DRG: 193 | Disposition: A | Discharge status: Other Acute Inpatient Hospital | Payer: Medicare Other | Attending: Internal Medicine | Admitting: Internal Medicine

## 2018-03-01 DIAGNOSIS — R918 Other nonspecific abnormal finding of lung field: Secondary | ICD-10-CM | POA: Diagnosis not present

## 2018-03-01 DIAGNOSIS — Z9889 Other specified postprocedural states: Secondary | ICD-10-CM

## 2018-03-01 DIAGNOSIS — Z66 Do not resuscitate: Secondary | ICD-10-CM | POA: Diagnosis present

## 2018-03-01 DIAGNOSIS — I129 Hypertensive chronic kidney disease with stage 1 through stage 4 chronic kidney disease, or unspecified chronic kidney disease: Secondary | ICD-10-CM | POA: Diagnosis present

## 2018-03-01 DIAGNOSIS — N400 Enlarged prostate without lower urinary tract symptoms: Secondary | ICD-10-CM | POA: Diagnosis present

## 2018-03-01 DIAGNOSIS — R0602 Shortness of breath: Secondary | ICD-10-CM | POA: Diagnosis present

## 2018-03-01 DIAGNOSIS — J9601 Acute respiratory failure with hypoxia: Secondary | ICD-10-CM | POA: Diagnosis present

## 2018-03-01 DIAGNOSIS — J918 Pleural effusion in other conditions classified elsewhere: Secondary | ICD-10-CM

## 2018-03-01 DIAGNOSIS — J189 Pneumonia, unspecified organism: Secondary | ICD-10-CM

## 2018-03-01 DIAGNOSIS — F17201 Nicotine dependence, unspecified, in remission: Secondary | ICD-10-CM | POA: Diagnosis not present

## 2018-03-01 DIAGNOSIS — J154 Pneumonia due to other streptococci: Secondary | ICD-10-CM | POA: Diagnosis present

## 2018-03-01 DIAGNOSIS — E669 Obesity, unspecified: Secondary | ICD-10-CM | POA: Diagnosis present

## 2018-03-01 DIAGNOSIS — N179 Acute kidney failure, unspecified: Secondary | ICD-10-CM | POA: Diagnosis present

## 2018-03-01 DIAGNOSIS — J90 Pleural effusion, not elsewhere classified: Secondary | ICD-10-CM | POA: Diagnosis not present

## 2018-03-01 DIAGNOSIS — E876 Hypokalemia: Secondary | ICD-10-CM | POA: Diagnosis not present

## 2018-03-01 DIAGNOSIS — F17211 Nicotine dependence, cigarettes, in remission: Secondary | ICD-10-CM | POA: Diagnosis present

## 2018-03-01 DIAGNOSIS — Y95 Nosocomial condition: Secondary | ICD-10-CM | POA: Diagnosis present

## 2018-03-01 DIAGNOSIS — D649 Anemia, unspecified: Secondary | ICD-10-CM | POA: Diagnosis present

## 2018-03-01 DIAGNOSIS — J439 Emphysema, unspecified: Secondary | ICD-10-CM | POA: Diagnosis present

## 2018-03-01 DIAGNOSIS — I69354 Hemiplegia and hemiparesis following cerebral infarction affecting left non-dominant side: Secondary | ICD-10-CM

## 2018-03-01 DIAGNOSIS — J869 Pyothorax without fistula: Secondary | ICD-10-CM | POA: Diagnosis present

## 2018-03-01 DIAGNOSIS — Z831 Family history of other infectious and parasitic diseases: Secondary | ICD-10-CM

## 2018-03-01 DIAGNOSIS — Z79899 Other long term (current) drug therapy: Secondary | ICD-10-CM

## 2018-03-01 DIAGNOSIS — I4891 Unspecified atrial fibrillation: Secondary | ICD-10-CM

## 2018-03-01 DIAGNOSIS — Z6831 Body mass index (BMI) 31.0-31.9, adult: Secondary | ICD-10-CM

## 2018-03-01 DIAGNOSIS — Z9181 History of falling: Secondary | ICD-10-CM

## 2018-03-01 DIAGNOSIS — D72829 Elevated white blood cell count, unspecified: Secondary | ICD-10-CM | POA: Diagnosis not present

## 2018-03-01 DIAGNOSIS — C801 Malignant (primary) neoplasm, unspecified: Secondary | ICD-10-CM

## 2018-03-01 DIAGNOSIS — N183 Chronic kidney disease, stage 3 (moderate): Secondary | ICD-10-CM | POA: Diagnosis present

## 2018-03-01 DIAGNOSIS — J9690 Respiratory failure, unspecified, unspecified whether with hypoxia or hypercapnia: Secondary | ICD-10-CM | POA: Diagnosis present

## 2018-03-01 LAB — CBC WITH DIFFERENTIAL/PLATELET
ABS IMMATURE GRANULOCYTES: 1.19 10*3/uL — AB (ref 0.00–0.07)
BASOS ABS: 0.2 10*3/uL — AB (ref 0.0–0.1)
Basophils Relative: 1 %
EOS PCT: 0 %
Eosinophils Absolute: 0.1 10*3/uL (ref 0.0–0.5)
HCT: 36.3 % — ABNORMAL LOW (ref 39.0–52.0)
Hemoglobin: 12 g/dL — ABNORMAL LOW (ref 13.0–17.0)
Immature Granulocytes: 3 %
LYMPHS PCT: 6 %
Lymphs Abs: 2.4 10*3/uL (ref 0.7–4.0)
MCH: 30.7 pg (ref 26.0–34.0)
MCHC: 33.1 g/dL (ref 30.0–36.0)
MCV: 92.8 fL (ref 80.0–100.0)
MONO ABS: 1.5 10*3/uL — AB (ref 0.1–1.0)
MONOS PCT: 4 %
NRBC: 0 % (ref 0.0–0.2)
Neutro Abs: 31.8 10*3/uL — ABNORMAL HIGH (ref 1.7–7.7)
Neutrophils Relative %: 86 %
PLATELETS: 392 10*3/uL (ref 150–400)
RBC: 3.91 MIL/uL — ABNORMAL LOW (ref 4.22–5.81)
RDW: 14.9 % (ref 11.5–15.5)
WBC: 37.1 10*3/uL — AB (ref 4.0–10.5)

## 2018-03-01 LAB — TSH: TSH: 2.347 u[IU]/mL (ref 0.350–4.500)

## 2018-03-01 LAB — BASIC METABOLIC PANEL
Anion gap: 14 (ref 5–15)
BUN: 68 mg/dL — AB (ref 8–23)
CO2: 21 mmol/L — ABNORMAL LOW (ref 22–32)
CREATININE: 1.75 mg/dL — AB (ref 0.61–1.24)
Calcium: 8.2 mg/dL — ABNORMAL LOW (ref 8.9–10.3)
Chloride: 105 mmol/L (ref 98–111)
GFR calc Af Amer: 40 mL/min — ABNORMAL LOW (ref >60)
GFR, EST NON AFRICAN AMERICAN: 35 mL/min — AB (ref >60)
Glucose, Bld: 139 mg/dL — ABNORMAL HIGH (ref 70–99)
POTASSIUM: 3.7 mmol/L (ref 3.5–5.1)
SODIUM: 140 mmol/L (ref 135–145)

## 2018-03-01 LAB — LACTIC ACID, PLASMA: Lactic Acid, Venous: 2.3 mmol/L (ref 0.5–1.9)

## 2018-03-01 LAB — TROPONIN I: Troponin I: 0.03 ng/mL (ref <0.03)

## 2018-03-01 LAB — T4, FREE: Free T4: 1.29 ng/dL (ref 0.82–1.77)

## 2018-03-01 LAB — FIBRIN DERIVATIVES D-DIMER (ARMC ONLY): Fibrin derivatives D-dimer (ARMC): >7500 ng/mL (FEU) — ABNORMAL HIGH (ref 0.00–499.00)

## 2018-03-01 LAB — BRAIN NATRIURETIC PEPTIDE: B Natriuretic Peptide: 209 pg/mL — ABNORMAL HIGH (ref 0.0–100.0)

## 2018-03-01 LAB — MAGNESIUM: Magnesium: 2.1 mg/dL (ref 1.7–2.4)

## 2018-03-01 MED ORDER — IPRATROPIUM-ALBUTEROL 0.5-2.5 (3) MG/3ML IN SOLN: 3.0000 mL | Freq: Once | RESPIRATORY_TRACT | Status: DC

## 2018-03-01 MED ORDER — IOHEXOL 350 MG/ML SOLN
75.0000 mL | Freq: Once | INTRAVENOUS | Status: AC | PRN
  Administered 2018-03-01: 75 mL via INTRAVENOUS

## 2018-03-01 MED ORDER — IPRATROPIUM-ALBUTEROL 0.5-2.5 (3) MG/3ML IN SOLN
RESPIRATORY_TRACT | Status: AC
  Filled 2018-03-01: qty 3

## 2018-03-01 MED ORDER — DILTIAZEM HCL-DEXTROSE 100-5 MG/100ML-% IV SOLN (PREMIX)
5.0000 mg/h | Freq: Once | INTRAVENOUS | Status: AC
  Administered 2018-03-01: 5 mg/h via INTRAVENOUS
  Filled 2018-03-01: qty 100

## 2018-03-01 MED ORDER — IPRATROPIUM-ALBUTEROL 0.5-2.5 (3) MG/3ML IN SOLN
RESPIRATORY_TRACT | Status: AC
  Administered 2018-03-01: 9 mL via RESPIRATORY_TRACT
  Filled 2018-03-01: qty 6

## 2018-03-01 MED ORDER — SODIUM CHLORIDE 0.9 % IV SOLN
1.0000 g | Freq: Once | INTRAVENOUS | Status: AC
  Administered 2018-03-01: 1 g via INTRAVENOUS
  Filled 2018-03-01: qty 1

## 2018-03-01 MED ORDER — VANCOMYCIN HCL IN DEXTROSE 1-5 GM/200ML-% IV SOLN
1000.0000 mg | Freq: Once | INTRAVENOUS | Status: AC
  Administered 2018-03-01: 1000 mg via INTRAVENOUS
  Filled 2018-03-01: qty 200

## 2018-03-01 MED ORDER — METHYLPREDNISOLONE SODIUM SUCC 125 MG IJ SOLR
125.0000 mg | Freq: Once | INTRAMUSCULAR | Status: AC
  Administered 2018-03-01: 125 mg via INTRAVENOUS
  Filled 2018-03-01: qty 2

## 2018-03-01 MED ORDER — MAGNESIUM SULFATE 2 GM/50ML IV SOLN
2.0000 g | Freq: Once | INTRAVENOUS | Status: AC
  Administered 2018-03-01: 2 g via INTRAVENOUS
  Filled 2018-03-01: qty 50

## 2018-03-01 MED ORDER — SODIUM CHLORIDE 0.9 % IV BOLUS
1000.0000 mL | Freq: Once | INTRAVENOUS | Status: AC
  Administered 2018-03-01: 1000 mL via INTRAVENOUS

## 2018-03-01 MED ORDER — IPRATROPIUM-ALBUTEROL 0.5-2.5 (3) MG/3ML IN SOLN
9.0000 mL | Freq: Once | RESPIRATORY_TRACT | Status: AC
  Administered 2018-03-01: 9 mL via RESPIRATORY_TRACT

## 2018-03-01 MED ORDER — DILTIAZEM HCL ER COATED BEADS 180 MG PO CP24
180.0000 mg | ORAL_CAPSULE | Freq: Once | ORAL | Status: AC
  Administered 2018-03-01: 180 mg via ORAL
  Filled 2018-03-01: qty 1

## 2018-03-01 MED ORDER — DILTIAZEM HCL 25 MG/5ML IV SOLN
10.0000 mg | Freq: Once | INTRAVENOUS | Status: AC
  Administered 2018-03-01: 10 mg via INTRAVENOUS
  Filled 2018-03-01: qty 5

## 2018-03-01 NOTE — ED Notes (Signed)
EDP [place] notified in person of Trop 0.03. No new orders.

## 2018-03-01 NOTE — ED Notes (Signed)
Patient transported to CT 

## 2018-03-01 NOTE — ED Triage Notes (Signed)
Pt arrived via EMS from home. Pt reports SOB all day and it wouldn't get better. Reports recent hospitalization for COPD exac. 88% sat RA per EMS; 93% on 6L HFNC. 95/40 BP. 1L bolus NS given per EMS. BG 132. 20g L ac IV placed by EMS.

## 2018-03-01 NOTE — ED Provider Notes (Signed)
[facility] Emergency Department Provider Note  ____________________________________________  Time seen: Approximately 6:08 PM  I have reviewed the triage vital signs and the nursing notes.   HISTORY  Chief Complaint Shortness of Breath; Respiratory Distress; and Atrial Fibrillation   HPI Erik Carey is a 83 y.o. male with a history of COPD, CVA, hypertension who presents for evaluation of shortness of breath.  Patient reports progressively worsening shortness of breath for the last few days with a dry cough.  Today the shortness of breath became severe.  Inhalers were not helping him today.  No fever chills, body aches, vomiting or diarrhea.  He denies chest pain or hemoptysis, no personal family history of blood clots, no recent travel immobilization, no leg pain or swelling, no hemoptysis, no exogenous hormones.  Patient was found hypoxic at 88% per EMS and hypotensive, also with new afib w/ RVR. Given 1L NS en route.    Past Medical History:  Diagnosis Date  . COPD (chronic obstructive pulmonary disease) ([place])   . CVA (cerebral vascular accident) ([place])   . Hypertension   . Left-sided weakness   . Slurred speech   . Tobacco use     Patient Active Problem List   Diagnosis Date Noted  . Pressure injury of skin 12/17/2017  . Rhabdomyolysis 12/15/2017    No past surgical history on file.  Prior to Admission medications   Medication Sig Start Date End Date Taking? Authorizing Provider  albuterol (PROVENTIL HFA;VENTOLIN HFA) 108 (90 Base) MCG/ACT inhaler Inhale 2 puffs into the lungs every 6 (six) hours as needed for wheezing or shortness of breath.    [provider]  allopurinol (ZYLOPRIM) 100 MG tablet Take 400 mg by mouth daily.     [provider]  diphenoxylate-atropine (LOMOTIL) 2.5-0.025 MG tablet Take by mouth 4 (four) times daily as needed for diarrhea or loose stools.    [provider]  hydrochlorothiazide  (HYDRODIURIL) 25 MG tablet Take 25 mg by mouth daily.    [provider]  lisinopril (PRINIVIL,ZESTRIL) 40 MG tablet Take 40 mg by mouth daily.    [provider]  loperamide (IMODIUM) 2 MG capsule Take 1 capsule (2 mg total) by mouth every 6 (six) hours as needed for diarrhea or loose stools. 12/18/17   Hillary Bow, MD  metoprolol tartrate (LOPRESSOR) 50 MG tablet Take 25 mg by mouth 2 (two) times daily.    [provider]  pantoprazole (PROTONIX) 40 MG tablet Take 40 mg by mouth at bedtime.     [provider]  predniSONE (DELTASONE) 50 MG tablet Take 1 tablet (50 mg total) by mouth daily with breakfast. 12/18/17   Hillary Bow, MD  tamsulosin (FLOMAX) 0.4 MG CAPS capsule Take 0.4 mg by mouth daily.     [provider]    Allergies Patient has no known allergies.  Family History  Problem Relation Age of Onset  . Tuberculosis Mother     Social History Social History   Tobacco Use  . Smoking status: Former Smoker    Last attempt to quit: 11/29/2017    Years since quitting: 0.2  . Smokeless tobacco: Never Used  Substance Use Topics  . Alcohol use: Not Currently  . Drug use: Not Currently    Review of Systems  Constitutional: Negative for fever. Eyes: Negative for visual changes. ENT: Negative for sore throat. Neck: No neck pain  Cardiovascular: Negative for chest pain. Respiratory: + shortness of breath and cough  Gastrointestinal: Negative for abdominal pain, vomiting or diarrhea. Genitourinary: Negative for dysuria. Musculoskeletal: Negative for back pain. Skin: Negative for rash. Neurological: Negative for headaches, weakness or numbness. Psych: No SI or HI  ____________________________________________   PHYSICAL EXAM:  VITAL SIGNS:  97.17F   38   88%   147   98/72   Constitutional: Alert and oriented, severe respiratory distress. HEENT:      Head: Normocephalic and atraumatic.         Eyes: Conjunctivae are  normal. Sclera is non-icteric.       Mouth/Throat: Mucous membranes are moist.       Neck: Supple with no signs of meningismus. Cardiovascular: Regular rate and rhythm. No murmurs, gallops, or rubs. 2+ symmetrical distal pulses are present in all extremities. No JVD. Respiratory: Patient is tachypneic, hypoxic, severely diminished air movement with expiratory wheezes throughout  Gastrointestinal: Soft, non tender, and non distended with positive bowel sounds. No rebound or guarding. Musculoskeletal: Nontender with normal range of motion in all extremities. No edema, cyanosis, or erythema of extremities. Neurologic: Normal speech and language. Face is symmetric. Moving all extremities. No gross focal neurologic deficits are appreciated. Skin: Skin is warm, dry and intact. No rash noted. Psychiatric: Mood and affect are normal. Speech and behavior are normal.  ____________________________________________   LABS (all labs ordered are listed, but only abnormal results are displayed)  Labs Reviewed  CBC WITH DIFFERENTIAL/PLATELET - Abnormal; Notable for the following components:      Result Value   WBC 37.1 (*)    RBC 3.91 (*)    Hemoglobin 12.0 (*)    HCT 36.3 (*)    Neutro Abs 31.8 (*)    Monocytes Absolute 1.5 (*)    Basophils Absolute 0.2 (*)    Abs Immature Granulocytes 1.19 (*)    All other components within normal limits  BASIC METABOLIC PANEL - Abnormal; Notable for the following components:   CO2 21 (*)    Glucose, Bld 139 (*)    BUN 68 (*)    Creatinine, Ser 1.75 (*)    Calcium 8.2 (*)    GFR calc non Af Amer 35 (*)    GFR calc Af Amer 40 (*)    All other components within normal limits  BRAIN NATRIURETIC PEPTIDE - Abnormal; Notable for the following components:   B Natriuretic Peptide 209.0 (*)    All other components within normal limits  FIBRIN DERIVATIVES D-DIMER ([facility] ONLY) - Abnormal; Notable for the following components:   Fibrin derivatives D-dimer ([facility])  >7,500.00 (*)    All other components within normal limits  TROPONIN I - Abnormal; Notable for the following components:   Troponin I 0.03 (*)    All other components within normal limits  BLOOD GAS, VENOUS - Abnormal; Notable for the following components:   pCO2, Ven 37 (*)    All other components within normal limits  CULTURE, BLOOD (ROUTINE X 2)  CULTURE, BLOOD (ROUTINE X 2)  MAGNESIUM  TSH  T4, FREE  LACTIC ACID, PLASMA   ____________________________________________  EKG  ED ECG REPORT I, Rudene Re, the attending physician, personally viewed and interpreted this ECG.  Atrial fibrillation, rate of 144, normal QTC, normal axis, slight ST depressions diffusely.  These are new when compared to prior. ____________________________________________  RADIOLOGY  I have personally reviewed the images performed during this visit and I agree with the Radiologist's read.   Interpretation by Radiologist:  Ct Angio Chest Pe W And/or Wo Contrast  Result Date: 03/01/2018 CLINICAL DATA:  Pt arrived via EMS from home. Pt reports SOB all day and it wouldn't get better. Reports recent hospitalization for COPD exacerbation. EXAM: CT ANGIOGRAPHY CHEST WITH CONTRAST TECHNIQUE: Multidetector CT imaging of the chest was performed using the standard protocol during bolus administration of intravenous contrast. Multiplanar CT image reconstructions and MIPs were obtained to evaluate the vascular anatomy. CONTRAST:  3mL OMNIPAQUE IOHEXOL 350 MG/ML SOLN COMPARISON:  Chest x-ray on 03/01/2018 FINDINGS: Cardiovascular: Heart size is normal. No pericardial effusion. Coronary artery calcifications are present. Thoracic aortic calcifications are present. No aneurysm or dissection. The pulmonary arteries are well opacified and there is no evidence for acute pulmonary embolus. Mediastinum/Nodes: Esophagus is normal in appearance. No significant mediastinal adenopathy. The visualized portion of the thyroid  gland has a normal appearance. Lungs/Pleura: There are emphysematous changes in lungs, primarily within the apices. There are multiple loculated fluid collections within the RIGHT pleural space. The largest of these is identified in the RIGHT LOWER lobe, measuring 6.2 x 10.4 centimeters. There is adjacent atelectatic enhancement associated with these collections. However no split pleural sign to indicate presence of empyema. Within the RIGHT hilar region there is a solid mass, measuring 6.3 x 7.0 x 4.5 centimeters. The adjacent bronchus appears occluded and the findings are suspicious for malignancy. Upper Abdomen: Cholecystectomy. Atherosclerotic calcification of the abdominal aorta. Musculoskeletal: Midthoracic spondylosis. No suspicious lytic or blastic lesions are identified. Review of the MIP images confirms the above findings. IMPRESSION: 1. Technically adequate exam showing no acute pulmonary embolus. 2. RIGHT hilar mass measuring up to 7.0 centimeters suspicious for malignancy. There is occlusion of the adjacent bronchus, suspicious for endobronchial lesion. Recommend bronchoscopy. 3. Multiple loculated pleural fluid collections, suspicious for malignant effusion. No evidence for empyema. 4.  Emphysema (ICD10-J43.9). 5.  Aortic Atherosclerosis (ICD10-I70.0). Electronically Signed   By: Nolon Nations M.D.   On: 03/01/2018 20:21   Dg Chest Portable 1 View  Result Date: 03/01/2018 CLINICAL DATA:  Hx of COPD, tachy, extreme SOB, breathing treatment in progress, SMokerPt unable to give pertinent hx. EXAM: PORTABLE CHEST 1 VIEW COMPARISON:  None. FINDINGS: There is focal dense opacity in the right upper lung with hazy opacity throughout the remainder of the right lung superimposed on interstitial thickening. There is volume loss on the right relative hyperexpansion of the left lung. Cardiac silhouette is normal in size. No mediastinal or hilar masses. Left lung is clear. Possible small right pleural  effusion. No evidence of a left pleural effusion. No pneumothorax. Skeletal structures are intact. IMPRESSION: 1. Abnormal findings in the right lung with upper lobe dense consolidation versus a possible mass, hazy opacity in the remaining right lung superimposed on interstitial thickening. Possible small right pleural effusion. Volume loss on the right. Recommend follow-up chest CT with contrast for further assessment. Electronically Signed   By: Lajean Manes M.D.   On: 03/01/2018 18:45     ____________________________________________   PROCEDURES  Procedure(s) performed: None Procedures Critical Care performed: yes  CRITICAL CARE Performed by: Rudene Re  ?  Total critical care time: 45 min  Critical care time was exclusive of separately billable procedures and treating other patients.  Critical care was necessary to treat or prevent imminent or life-threatening deterioration.  Critical care was time spent personally by me on the following activities: development of treatment plan with patient and/or surrogate as well as nursing, discussions with consultants, evaluation of patient's response to treatment, examination of patient, obtaining history from patient or  surrogate, ordering and performing treatments and interventions, ordering and review of laboratory studies, ordering and review of radiographic studies, pulse oximetry and re-evaluation of patient's condition.  ____________________________________________   INITIAL IMPRESSION / ASSESSMENT AND PLAN / ED COURSE  83 y.o. male with a history of COPD, CVA, hypertension who presents for evaluation of shortness of breath.  Patient arrives in moderate to severe respiratory distress, hypoxic, severely diminished air movement bilaterally with expiratory wheezes concerning for COPD exacerbation.  Patient also with new onset of atrial fibrillation with RVR.  Will start patient on DuoNeb's, IV magnesium, IV Cardizem, and IV fluids  for low to normal blood pressure.  Chest x-ray and labs are pending.    _________________________ 8:31 PM on 03/01/2018 -----------------------------------------  Chest x-ray concerning for right upper lobe mass therefore CT was done which is negative for PE but does confirm a large mass on the right side concerning for cancer with pleural effusion.  Patient's rate is markedly improved currently in the 110s on Cardizem drip.  White count of 37K. Imaging limited due to mass but will cover with abx for possible overlying infection as well. Patient informed of results and findings of CT. Will discuss with Hospitalist for admission.   As part of my medical decision making, I reviewed the following data within the [facility] notes reviewed and incorporated, Labs reviewed , EKG interpreted , Old EKG reviewed, Old chart reviewed, Radiograph reviewed , Discussed with admitting physician , Notes from prior ED visits and [place] Controlled Substance Database    Pertinent labs & imaging results that were available during my care of the patient were reviewed by me and considered in my medical decision making (see chart for details).    ____________________________________________   FINAL CLINICAL IMPRESSION(S) / ED DIAGNOSES  Final diagnoses:  Acute respiratory failure with hypoxia ([place])  Malignancy ([place])  Atrial fibrillation with RVR ([place])      NEW MEDICATIONS STARTED DURING THIS VISIT:  ED Discharge Orders    None       Note:  This document was prepared using Dragon voice recognition software and may include unintentional dictation errors.    Rudene Re, MD 03/01/18 2033

## 2018-03-01 NOTE — ED Notes (Signed)
CRITICAL LAB: [facility] is 2.3, [facility], Dr. Alfred Levins notified, orders received

## 2018-03-01 NOTE — ED Notes (Signed)
Drako Maese, daughter, cell, [phone]  Leave message for room number

## 2018-03-02 ENCOUNTER — Inpatient Hospital Stay: Payer: Medicare Other

## 2018-03-02 ENCOUNTER — Other Ambulatory Visit: Payer: Self-pay

## 2018-03-02 DIAGNOSIS — J918 Pleural effusion in other conditions classified elsewhere: Secondary | ICD-10-CM

## 2018-03-02 DIAGNOSIS — Z9889 Other specified postprocedural states: Secondary | ICD-10-CM

## 2018-03-02 DIAGNOSIS — R918 Other nonspecific abnormal finding of lung field: Secondary | ICD-10-CM

## 2018-03-02 DIAGNOSIS — J189 Pneumonia, unspecified organism: Secondary | ICD-10-CM

## 2018-03-02 DIAGNOSIS — J9601 Acute respiratory failure with hypoxia: Secondary | ICD-10-CM

## 2018-03-02 DIAGNOSIS — J90 Pleural effusion, not elsewhere classified: Secondary | ICD-10-CM

## 2018-03-02 DIAGNOSIS — I4891 Unspecified atrial fibrillation: Secondary | ICD-10-CM

## 2018-03-02 DIAGNOSIS — F17211 Nicotine dependence, cigarettes, in remission: Secondary | ICD-10-CM

## 2018-03-02 LAB — URINALYSIS, COMPLETE (UACMP) WITH MICROSCOPIC
Bacteria, UA: NONE SEEN
Bilirubin Urine: NEGATIVE
Glucose, UA: NEGATIVE mg/dL
Hgb urine dipstick: NEGATIVE
Ketones, ur: NEGATIVE mg/dL
Leukocytes, UA: NEGATIVE
Nitrite: NEGATIVE
Protein, ur: NEGATIVE mg/dL
Specific Gravity, Urine: 1.043 — ABNORMAL HIGH (ref 1.005–1.030)
pH: 5 (ref 5.0–8.0)

## 2018-03-02 LAB — CBC
HCT: 31.1 % — ABNORMAL LOW (ref 39.0–52.0)
Hemoglobin: 10.2 g/dL — ABNORMAL LOW (ref 13.0–17.0)
MCH: 30.6 pg (ref 26.0–34.0)
MCHC: 32.8 g/dL (ref 30.0–36.0)
MCV: 93.4 fL (ref 80.0–100.0)
Platelets: 323 K/uL (ref 150–400)
RBC: 3.33 MIL/uL — ABNORMAL LOW (ref 4.22–5.81)
RDW: 14.7 % (ref 11.5–15.5)
WBC: 26.2 K/uL — ABNORMAL HIGH (ref 4.0–10.5)
nRBC: 0 % (ref 0.0–0.2)

## 2018-03-02 LAB — BLOOD GAS, VENOUS
ACID-BASE DEFICIT: 1.6 mmol/L (ref 0.0–2.0)
Bicarbonate: 22.9 mmol/L (ref 20.0–28.0)
O2 SAT: 59.5 %
Patient temperature: 37
pCO2, Ven: 37 mmHg — ABNORMAL LOW (ref 44.0–60.0)
pH, Ven: 7.4 (ref 7.250–7.430)

## 2018-03-02 LAB — BASIC METABOLIC PANEL WITH GFR
Anion gap: 10 (ref 5–15)
BUN: 60 mg/dL — ABNORMAL HIGH (ref 8–23)
CO2: 21 mmol/L — ABNORMAL LOW (ref 22–32)
Calcium: 8 mg/dL — ABNORMAL LOW (ref 8.9–10.3)
Chloride: 108 mmol/L (ref 98–111)
Creatinine, Ser: 1.48 mg/dL — ABNORMAL HIGH (ref 0.61–1.24)
GFR calc Af Amer: 49 mL/min — ABNORMAL LOW (ref >60)
GFR calc non Af Amer: 43 mL/min — ABNORMAL LOW (ref >60)
Glucose, Bld: 165 mg/dL — ABNORMAL HIGH (ref 70–99)
Potassium: 3.7 mmol/L (ref 3.5–5.1)
Sodium: 139 mmol/L (ref 135–145)

## 2018-03-02 LAB — PROTEIN, PLEURAL OR PERITONEAL FLUID: Total protein, fluid: 3.9 g/dL

## 2018-03-02 LAB — LACTIC ACID, PLASMA: LACTIC ACID, VENOUS: 1.5 mmol/L (ref 0.5–1.9)

## 2018-03-02 LAB — LACTATE DEHYDROGENASE, PLEURAL OR PERITONEAL FLUID: LD, Fluid: 5970 U/L — ABNORMAL HIGH (ref 3–23)

## 2018-03-02 LAB — MRSA PCR SCREENING: MRSA by PCR: NEGATIVE

## 2018-03-02 MED ORDER — SODIUM CHLORIDE 0.9 % IV SOLN
2.0000 g | INTRAVENOUS | Status: DC
  Administered 2018-03-02: 2 g via INTRAVENOUS
  Filled 2018-03-02: qty 2

## 2018-03-02 MED ORDER — IPRATROPIUM-ALBUTEROL 0.5-2.5 (3) MG/3ML IN SOLN
3.0000 mL | Freq: Four times a day (QID) | RESPIRATORY_TRACT | Status: DC
  Administered 2018-03-02 – 2018-03-03 (×4): 3 mL via RESPIRATORY_TRACT
  Filled 2018-03-02 (×7): qty 3

## 2018-03-02 MED ORDER — ALLOPURINOL 300 MG PO TABS
400.0000 mg | ORAL_TABLET | Freq: Every day | ORAL | Status: DC
  Administered 2018-03-02 – 2018-03-03 (×2): 400 mg via ORAL
  Administered 2018-03-04: 300 mg via ORAL
  Administered 2018-03-05 – 2018-03-08 (×4): 400 mg via ORAL
  Filled 2018-03-02 (×7): qty 1

## 2018-03-02 MED ORDER — METHYLPREDNISOLONE SODIUM SUCC 40 MG IJ SOLR
40.0000 mg | Freq: Two times a day (BID) | INTRAMUSCULAR | Status: DC
  Administered 2018-03-03: 40 mg via INTRAVENOUS
  Filled 2018-03-02: qty 1

## 2018-03-02 MED ORDER — ALBUTEROL SULFATE (2.5 MG/3ML) 0.083% IN NEBU: 2.5000 mg | INHALATION_SOLUTION | Freq: Four times a day (QID) | RESPIRATORY_TRACT | Status: DC | PRN

## 2018-03-02 MED ORDER — ACETAMINOPHEN 650 MG RE SUPP: 650.0000 mg | Freq: Four times a day (QID) | RECTAL | Status: DC | PRN

## 2018-03-02 MED ORDER — METOPROLOL TARTRATE 25 MG PO TABS: 25.0000 mg | ORAL_TABLET | Freq: Two times a day (BID) | ORAL | Status: DC

## 2018-03-02 MED ORDER — DOCUSATE SODIUM 100 MG PO CAPS
100.0000 mg | ORAL_CAPSULE | Freq: Two times a day (BID) | ORAL | Status: DC
  Administered 2018-03-02 – 2018-03-08 (×13): 100 mg via ORAL
  Filled 2018-03-02 (×13): qty 1

## 2018-03-02 MED ORDER — LISINOPRIL 20 MG PO TABS: 40.0000 mg | ORAL_TABLET | Freq: Every day | ORAL | Status: DC

## 2018-03-02 MED ORDER — TAMSULOSIN HCL 0.4 MG PO CAPS
0.4000 mg | ORAL_CAPSULE | Freq: Every day | ORAL | Status: DC
  Administered 2018-03-02 – 2018-03-08 (×7): 0.4 mg via ORAL
  Filled 2018-03-02 (×7): qty 1

## 2018-03-02 MED ORDER — TRAZODONE HCL 50 MG PO TABS
25.0000 mg | ORAL_TABLET | Freq: Every evening | ORAL | Status: DC | PRN
  Administered 2018-03-02: 22:00:00 25 mg via ORAL
  Filled 2018-03-02: qty 1

## 2018-03-02 MED ORDER — VANCOMYCIN HCL 10 G IV SOLR
1250.0000 mg | INTRAVENOUS | Status: DC
  Administered 2018-03-02 – 2018-03-03 (×2): 1250 mg via INTRAVENOUS
  Filled 2018-03-02 (×2): qty 1250

## 2018-03-02 MED ORDER — PANTOPRAZOLE SODIUM 40 MG PO TBEC
40.0000 mg | DELAYED_RELEASE_TABLET | Freq: Every day | ORAL | Status: DC
  Administered 2018-03-02 – 2018-03-08 (×7): 40 mg via ORAL
  Filled 2018-03-02 (×7): qty 1

## 2018-03-02 MED ORDER — ONDANSETRON HCL 4 MG PO TABS: 4.0000 mg | ORAL_TABLET | Freq: Four times a day (QID) | ORAL | Status: DC | PRN

## 2018-03-02 MED ORDER — SODIUM CHLORIDE 0.9 % IV SOLN
2.0000 g | Freq: Two times a day (BID) | INTRAVENOUS | Status: DC
  Filled 2018-03-02 (×2): qty 2

## 2018-03-02 MED ORDER — BISACODYL 5 MG PO TBEC: 5.0000 mg | DELAYED_RELEASE_TABLET | Freq: Every day | ORAL | Status: DC | PRN

## 2018-03-02 MED ORDER — ONDANSETRON HCL 4 MG/2ML IJ SOLN: 4.0000 mg | Freq: Four times a day (QID) | INTRAMUSCULAR | Status: DC | PRN

## 2018-03-02 MED ORDER — SODIUM CHLORIDE 0.9 % IV SOLN
INTRAVENOUS | Status: DC
  Administered 2018-03-02: 03:00:00 via INTRAVENOUS

## 2018-03-02 MED ORDER — HYDROCODONE-ACETAMINOPHEN 5-325 MG PO TABS
1.0000 | ORAL_TABLET | ORAL | Status: DC | PRN
  Administered 2018-03-04 – 2018-03-08 (×2): 1 via ORAL
  Filled 2018-03-02 (×2): qty 1

## 2018-03-02 MED ORDER — ACETAMINOPHEN 325 MG PO TABS: 650.0000 mg | ORAL_TABLET | Freq: Four times a day (QID) | ORAL | Status: DC | PRN

## 2018-03-02 MED ORDER — SODIUM CHLORIDE 0.9 % IV SOLN
2.0000 g | Freq: Two times a day (BID) | INTRAVENOUS | Status: DC
  Administered 2018-03-02: 2 g via INTRAVENOUS
  Filled 2018-03-02 (×3): qty 2

## 2018-03-02 MED ORDER — METHYLPREDNISOLONE SODIUM SUCC 125 MG IJ SOLR
80.0000 mg | Freq: Two times a day (BID) | INTRAMUSCULAR | Status: DC
  Administered 2018-03-02: 80 mg via INTRAVENOUS
  Filled 2018-03-02: qty 2

## 2018-03-02 MED ORDER — DILTIAZEM HCL ER COATED BEADS 180 MG PO CP24
180.0000 mg | ORAL_CAPSULE | Freq: Every day | ORAL | Status: DC
  Administered 2018-03-02 – 2018-03-08 (×7): 180 mg via ORAL
  Filled 2018-03-02 (×7): qty 1

## 2018-03-02 NOTE — Progress Notes (Signed)
Patient ID: Erik Carey, male   DOB: [birth year], 83 y.o.   MRN: [id number]  [employer] PROGRESS NOTE  ARIV PENROD NIO:[id number] DOB: 29-Jul-1934 DOA: 03/01/2018 PCP: Raelyn Mora, MD  HPI/Subjective: Patient feels a little bit better than yesterday.  Still has some shortness of breath.  Still has some cough but nonproductive.  Little bit of a wheeze.  Objective: Vitals:   03/02/18 0813 03/02/18 1418  BP:  (!) 120/54  Pulse: 83 81  Resp: 20   Temp:    SpO2: 92% 95%    Filed Weights   03/01/18 1900 03/02/18 0226  Weight: 99.8 kg 94.2 kg    ROS: Review of Systems  Constitutional: Negative for chills and fever.  Eyes: Negative for blurred vision.  Respiratory: Positive for cough, shortness of breath and wheezing.   Cardiovascular: Negative for chest pain.  Gastrointestinal: Negative for abdominal pain, constipation, diarrhea, nausea and vomiting.  Genitourinary: Negative for dysuria.  Musculoskeletal: Negative for joint pain.  Neurological: Negative for dizziness and headaches.   Exam: Physical Exam  Constitutional: He is oriented to person, place, and time.  HENT:  Nose: No mucosal edema.  Mouth/Throat: No oropharyngeal exudate or posterior oropharyngeal edema.  Eyes: Pupils are equal, round, and reactive to light. Conjunctivae, EOM and lids are normal.  Neck: No JVD present. Carotid bruit is not present. No edema present. No thyroid mass and no thyromegaly present.  Cardiovascular: S1 normal and S2 normal. Exam reveals no gallop.  No murmur heard. Pulses:      Dorsalis pedis pulses are 2+ on the right side, and 2+ on the left side.  Respiratory: No respiratory distress. He has decreased breath sounds in the right lower field and the left lower field. He has no wheezes. He has no rhonchi. He has no rales.  GI: Soft. Bowel sounds are normal. There is no tenderness.  Musculoskeletal:       Right ankle: He exhibits no swelling.       Left ankle: He exhibits  no swelling.  Lymphadenopathy:    He has no cervical adenopathy.  Neurological: He is alert and oriented to person, place, and time. No cranial nerve deficit.  Skin: Skin is warm. No rash noted. Nails show no clubbing.  Psychiatric: He has a normal mood and affect.      Data Reviewed: Basic Metabolic Panel: Recent Labs  Lab 03/01/18 1820 03/02/18 0433  NA 140 139  K 3.7 3.7  CL 105 108  CO2 21* 21*  GLUCOSE 139* 165*  BUN 68* 60*  CREATININE 1.75* 1.48*  CALCIUM 8.2* 8.0*  MG 2.1  --    CBC: Recent Labs  Lab 03/01/18 1820 03/02/18 0433  WBC 37.1* 26.2*  NEUTROABS 31.8*  --   HGB 12.0* 10.2*  HCT 36.3* 31.1*  MCV 92.8 93.4  PLT 392 323   Cardiac Enzymes: Recent Labs  Lab 03/01/18 1820  TROPONINI 0.03*   BNP (last 3 results) Recent Labs    03/01/18 1820  BNP 209.0*     Recent Results (from the past 240 hour(s))  Blood culture (routine x 2)     Status: None (Preliminary result)   Collection Time: 03/01/18  8:46 PM  Result Value Ref Range Status   Specimen Description BLOOD LEFT FOREARM  Final   Special Requests   Final    BOTTLES DRAWN AEROBIC AND ANAEROBIC Blood Culture adequate volume   Culture   Final    NO GROWTH < 12  HOURS Performed at [facility], [address] [place], [place] [address]    Report Status PENDING  Incomplete  Blood culture (routine x 2)     Status: None (Preliminary result)   Collection Time: 03/01/18  8:46 PM  Result Value Ref Range Status   Specimen Description BLOOD RIGHT ANTECUBITAL  Final   Special Requests   Final    BOTTLES DRAWN AEROBIC AND ANAEROBIC Blood Culture results may not be optimal due to an excessive volume of blood received in culture bottles   Culture   Final    NO GROWTH < 12 HOURS Performed at [facility], [address] [place], [place] [address]    Report Status PENDING  Incomplete  MRSA PCR Screening     Status: None   Collection Time: 03/02/18  8:35 AM  Result Value Ref  Range Status   MRSA by PCR NEGATIVE NEGATIVE Final    Comment:        The GeneXpert MRSA Assay (FDA approved for NASAL specimens only), is one component of a comprehensive MRSA colonization surveillance program. It is not intended to diagnose MRSA infection nor to guide or monitor treatment for MRSA infections. Performed at [facility], [address] [place], [place] [address]      Studies: Ct Angio Chest Pe W And/or Wo Contrast  Result Date: 03/01/2018 CLINICAL DATA:  Pt arrived via EMS from home. Pt reports SOB all day and it wouldn't get better. Reports recent hospitalization for COPD exacerbation. EXAM: CT ANGIOGRAPHY CHEST WITH CONTRAST TECHNIQUE: Multidetector CT imaging of the chest was performed using the standard protocol during bolus administration of intravenous contrast. Multiplanar CT image reconstructions and MIPs were obtained to evaluate the vascular anatomy. CONTRAST:  9mL OMNIPAQUE IOHEXOL 350 MG/ML SOLN COMPARISON:  Chest x-ray on 03/01/2018 FINDINGS: Cardiovascular: Heart size is normal. No pericardial effusion. Coronary artery calcifications are present. Thoracic aortic calcifications are present. No aneurysm or dissection. The pulmonary arteries are well opacified and there is no evidence for acute pulmonary embolus. Mediastinum/Nodes: Esophagus is normal in appearance. No significant mediastinal adenopathy. The visualized portion of the thyroid gland has a normal appearance. Lungs/Pleura: There are emphysematous changes in lungs, primarily within the apices. There are multiple loculated fluid collections within the RIGHT pleural space. The largest of these is identified in the RIGHT LOWER lobe, measuring 6.2 x 10.4 centimeters. There is adjacent atelectatic enhancement associated with these collections. However no split pleural sign to indicate presence of empyema. Within the RIGHT hilar region there is a solid mass, measuring 6.3 x 7.0 x 4.5 centimeters. The  adjacent bronchus appears occluded and the findings are suspicious for malignancy. Upper Abdomen: Cholecystectomy. Atherosclerotic calcification of the abdominal aorta. Musculoskeletal: Midthoracic spondylosis. No suspicious lytic or blastic lesions are identified. Review of the MIP images confirms the above findings. IMPRESSION: 1. Technically adequate exam showing no acute pulmonary embolus. 2. RIGHT hilar mass measuring up to 7.0 centimeters suspicious for malignancy. There is occlusion of the adjacent bronchus, suspicious for endobronchial lesion. Recommend bronchoscopy. 3. Multiple loculated pleural fluid collections, suspicious for malignant effusion. No evidence for empyema. 4.  Emphysema (ICD10-J43.9). 5.  Aortic Atherosclerosis (ICD10-I70.0). Electronically Signed   By: Nolon Nations M.D.   On: 03/01/2018 20:21   Dg Chest Portable 1 View  Result Date: 03/01/2018 CLINICAL DATA:  Hx of COPD, tachy, extreme SOB, breathing treatment in progress, SMokerPt unable to give pertinent hx. EXAM: PORTABLE CHEST 1 VIEW COMPARISON:  None. FINDINGS: There  is focal dense opacity in the right upper lung with hazy opacity throughout the remainder of the right lung superimposed on interstitial thickening. There is volume loss on the right relative hyperexpansion of the left lung. Cardiac silhouette is normal in size. No mediastinal or hilar masses. Left lung is clear. Possible small right pleural effusion. No evidence of a left pleural effusion. No pneumothorax. Skeletal structures are intact. IMPRESSION: 1. Abnormal findings in the right lung with upper lobe dense consolidation versus a possible mass, hazy opacity in the remaining right lung superimposed on interstitial thickening. Possible small right pleural effusion. Volume loss on the right. Recommend follow-up chest CT with contrast for further assessment. Electronically Signed   By: Lajean Manes M.D.   On: 03/01/2018 18:45    Scheduled Meds: . allopurinol   400 mg Oral Daily  . diltiazem  180 mg Oral Daily  . docusate sodium  100 mg Oral BID  . ipratropium-albuterol  3 mL Nebulization Q6H  . methylPREDNISolone (SOLU-MEDROL) injection  40 mg Intravenous Q12H  . pantoprazole  40 mg Oral QHS  . tamsulosin  0.4 mg Oral Daily   Continuous Infusions: . ceFEPime (MAXIPIME) IV    . vancomycin Stopped (03/02/18 0538)    Assessment/Plan:  1. Acute hypoxic respiratory failure.  Oxygen supplementation and try to taper as able to do so. 2. Pneumonia and leukocytosis.  Patient placed on aggressive antibiotics with Maxipime and vancomycin.  Will wait on thoracentesis before getting rid of the vancomycin. 3. Lung mass and loculated pleural effusions.  Case discussed with pulmonary and he ordered a thoracentesis for today and will try to set up bronchoscopy for tomorrow.  Spoke with interventional radiology earlier today.  Spoke with oncology to see the patient. 4. COPD exacerbation started on nebulizer treatments and Solu-Medrol.  Decrease dose down to 40 mg every 12. 5. Atrial fibrillation with rapid ventricular response on Cardizem orally.  Hold off on any anticoagulation at this time with biopsies planned. 6. Acute kidney injury on chronic kidney disease stage III.  Continue to monitor closely. 7. Tobacco abuse.  Smoking cessation counseling done 4 minutes by me.  Code Status:     Code Status Orders  (From admission, onward)         Start     Ordered   03/02/18 0227  Do not attempt resuscitation (DNR)  Continuous    Question Answer Comment  In the event of cardiac or respiratory ARREST Do not call a "code blue"   In the event of cardiac or respiratory ARREST Do not perform Intubation, CPR, defibrillation or ACLS   In the event of cardiac or respiratory ARREST Use medication by any route, position, wound care, and other measures to relive pain and suffering. May use oxygen, suction and manual treatment of airway obstruction as needed for comfort.       03/02/18 0226        Code Status History    Date Active Date Inactive Code Status Order ID Comments User Context   12/15/2017 1419 12/18/2017 2059 DNR [id number]  Hillary Bow, MD ED    Advance Directive Documentation     Most Recent Value  Type of Advance Directive  Living will  Pre-existing out of facility DNR order (yellow form or pink MOST form)  -  "MOST" Form in Place?  -     Disposition Plan: To be determined  Consultants:  Pulmonary  Oncology  Procedures:  Thoracentesis  Bronchoscopy for tomorrow  Antibiotics:  Vancomycin  Cefepime  Time spent: 35 minutes including coordination of care with speaking with interventional radiology, oncology and pulmonology  [place]

## 2018-03-02 NOTE — Progress Notes (Signed)
Pt signed consent for bronch tomorrow. Dorna Bloom RN

## 2018-03-02 NOTE — Consult Note (Signed)
[facility] Nurse wound consult note Reason for Consult: left hip pressure injury It is reported patient had a pressure injury on the left hip from "being down" or from a "fall" previously.  Wound type: Healed wound left hip, two scabbed areas  Pressure Injury POA: Yes Measurement: two small scabs 1cm x 1cm x 0cm with evidence of larger area of complete reepithelialization  Wound bed: scabs Drainage (amount, consistency, odor) none Periwound: intact  Dressing procedure/placement/frequency: Silicone foam to protect vunerable skin in at risk patient.   Discussed POC with patient and bedside nurse.  Re consult if needed, will not follow at this time. Thanks  Adyn Hoes [employer], RN,CWOCN, CNS, [place] [phone])

## 2018-03-02 NOTE — Progress Notes (Addendum)
Pharmacy Antibiotic Note  Erik Carey is a 83 y.o. male admitted on 03/01/2018 with pneumonia.  Pharmacy has been consulted for vanc/cefepime dosing. Patient received vanc 1g and cefepime 1g IV x 1 in ED  Plan: Will continue vanc 1.25g IV q24h w/ 6 hour stack  Will draw trough 10/31 @ 0300 prior to 4th dose. Will start cefepime 2g IV q12h  Ke 0.0363 T1/2 ~ 24 hrs Goal trough 15 - 20 mcg/mL  Height: 5\' 9"  (175.3 cm) Weight: 220 lb (99.8 kg) IBW/kg (Calculated) : 70.7  Temp (24hrs), Avg:97.5 F (36.4 C), Min:97.5 F (36.4 C), Max:97.5 F (36.4 C)  Recent Labs  Lab 03/01/18 1820 03/01/18 2046  WBC 37.1*  --   CREATININE 1.75*  --   LATICACIDVEN  --  2.3*    Estimated Creatinine Clearance: 38.5 mL/min (A) (by C-G formula based on SCr of 1.75 mg/dL (H)).    No Known Allergies  Thank you for allowing pharmacy to be a part of this patient's care.  Tobie Lords, PharmD, BCPS Clinical Pharmacist 03/02/2018

## 2018-03-02 NOTE — H&P (Addendum)
[facility] [place] NAME: Erik Carey    MR#:  [id number]  DATE OF BIRTH:  [birth year]  DATE OF ADMISSION:  03/01/2018  PRIMARY CARE PHYSICIAN: Raelyn Mora, MD   REQUESTING/REFERRING PHYSICIAN:   CHIEF COMPLAINT:   Chief Complaint  Patient presents with  . Shortness of Breath  . Respiratory Distress  . Atrial Fibrillation    HISTORY OF PRESENT ILLNESS: Erik Carey  is a 83 y.o. male with a known history of COPD, stroke, hypertension, tobacco use.  Patient states he was recently hospitalized for COPD exacerbation. Patient was brought to emergency room for severe shortness of breath and dry cough going on for the past few days, gradually getting worse, despite using inhalers at home.  Denies chest pain or hemoptysis, no fever or chills. At the arrival to emergency room patient was found to be hypoxic with oxygen saturation at 88% on room air. Cardiac monitor showed atrial fibrillation with RVR, which is new for this patient. Blood test done emergency room are notable for elevated WBC at 37,000 and elevated lactic acid level at 2.3.  Creatinine is 1.75 and BUN is 68.  Troponin is 0.03. CTA of the chest is negative for PE but it shows RIGHT hilar mass measuring up to 7.0 centimeters suspicious for malignancy. There is occlusion of the adjacent bronchus, suspicious for endobronchial lesion. Recommend bronchoscopy.  There are multiple loculated pleural fluid collections, suspicious for malignant effusion. No evidence for empyema.  Chronic emphysema noted. Patient is admitted for further evaluation and treatment.   PAST MEDICAL HISTORY:   Past Medical History:  Diagnosis Date  . COPD (chronic obstructive pulmonary disease) ([place])   . CVA (cerebral vascular accident) ([place])   . Hypertension   . Left-sided weakness   . Slurred speech   . Tobacco use     PAST SURGICAL HISTORY: History reviewed. No pertinent surgical  history.  SOCIAL HISTORY:  Social History   Tobacco Use  . Smoking status: Former Smoker    Last attempt to quit: 11/29/2017    Years since quitting: 0.2  . Smokeless tobacco: Never Used  Substance Use Topics  . Alcohol use: Not Currently    FAMILY HISTORY:  Family History  Problem Relation Age of Onset  . Tuberculosis Mother     DRUG ALLERGIES: No Known Allergies  REVIEW OF SYSTEMS:   CONSTITUTIONAL: No fever, but positive for fatigue generalized weakness.  EYES: No changes in vision.  EARS, NOSE, AND THROAT: No tinnitus or ear pain.  RESPIRATORY: [place] for cough, shortness of breath, wheezing, no hemoptysis.  CARDIOVASCULAR: No chest pain, orthopnea, edema.  GASTROINTESTINAL: No nausea, vomiting, diarrhea or abdominal pain.  GENITOURINARY: No dysuria, hematuria.  ENDOCRINE: No polyuria, nocturia. HEMATOLOGY: No bleeding. SKIN: No rash or lesion. MUSCULOSKELETAL: No joint pain at this time.   NEUROLOGIC: No focal weakness.  PSYCHIATRY: No anxiety or depression.   MEDICATIONS AT HOME:  Prior to Admission medications   Medication Sig Start Date End Date Taking? Authorizing Provider  albuterol (PROVENTIL HFA;VENTOLIN HFA) 108 (90 Base) MCG/ACT inhaler Inhale 2 puffs into the lungs every 6 (six) hours as needed for wheezing or shortness of breath.   Yes [provider]  allopurinol (ZYLOPRIM) 100 MG tablet Take 400 mg by mouth daily.    Yes [provider]  diphenoxylate-atropine (LOMOTIL) 2.5-0.025 MG tablet Take by mouth 4 (four) times daily as needed for diarrhea or loose stools.   Yes [provider]  pantoprazole (PROTONIX) 40 MG tablet Take 40 mg by mouth at bedtime.    Yes [provider]  tamsulosin (FLOMAX) 0.4 MG CAPS capsule Take 0.4 mg by mouth daily.    Yes [provider]  hydrochlorothiazide (HYDRODIURIL) 25 MG tablet Take 25 mg by mouth daily.    [provider]  lisinopril (PRINIVIL,ZESTRIL) 40 MG tablet  Take 40 mg by mouth daily.    [provider]  loperamide (IMODIUM) 2 MG capsule Take 1 capsule (2 mg total) by mouth every 6 (six) hours as needed for diarrhea or loose stools. Patient not taking: Reported on 03/01/2018 12/18/17   Hillary Bow, MD  metoprolol tartrate (LOPRESSOR) 50 MG tablet Take 25 mg by mouth 2 (two) times daily.    [provider]  predniSONE (DELTASONE) 50 MG tablet Take 1 tablet (50 mg total) by mouth daily with breakfast. Patient not taking: Reported on 03/01/2018 12/18/17   Hillary Bow, MD      PHYSICAL EXAMINATION:   VITAL SIGNS: Blood pressure 99/61, pulse 80, temperature 97.8 F (36.6 C), temperature source Oral, resp. rate (!) 24, height 5\' 9"  (1.753 m), weight 94.2 kg, SpO2 93 %.  GENERAL:  83 y.o.-year-old patient lying in the bed with moderate acute respiratory distress.  EYES: Pupils equal, round, reactive to light and accommodation. No scleral icterus. Extraocular muscles intact.  HEENT: Head atraumatic, normocephalic. Oropharynx and nasopharynx clear.  NECK:  Supple, no jugular venous distention. No thyroid enlargement, no tenderness.  LUNGS: Patient is tachypneic and hypoxic.  Reduced breath sounds and wheezing noted bilaterally.  Moderate use of accessory muscles of respiration is noted.  CARDIOVASCULAR: Irregularly irregular rhythm.  S1, S2 normal. No S3/S4.  ABDOMEN: Soft, nontender, nondistended. Bowel sounds present. EXTREMITIES: No pedal edema, cyanosis, or clubbing.  NEUROLOGIC: No focal weakness. PSYCHIATRIC: The patient is alert and oriented x 3.  SKIN: No obvious rash, lesion, or ulcer.   LABORATORY PANEL:   CBC Recent Labs  Lab 03/01/18 1820  WBC 37.1*  HGB 12.0*  HCT 36.3*  PLT 392  MCV 92.8  MCH 30.7  MCHC 33.1  RDW 14.9  LYMPHSABS 2.4  MONOABS 1.5*  EOSABS 0.1  BASOSABS 0.2*    ------------------------------------------------------------------------------------------------------------------  Chemistries  Recent Labs  Lab 03/01/18 1820  NA 140  K 3.7  CL 105  CO2 21*  GLUCOSE 139*  BUN 68*  CREATININE 1.75*  CALCIUM 8.2*  MG 2.1   ------------------------------------------------------------------------------------------------------------------ estimated creatinine clearance is 37.5 mL/min (A) (by C-G formula based on SCr of 1.75 mg/dL (H)). ------------------------------------------------------------------------------------------------------------------ Recent Labs    03/01/18 1820  TSH 2.347     Coagulation profile No results for input(s): INR, PROTIME in the last 168 hours. ------------------------------------------------------------------------------------------------------------------- No results for input(s): DDIMER in the last 72 hours. -------------------------------------------------------------------------------------------------------------------  Cardiac Enzymes Recent Labs  Lab 03/01/18 1820  TROPONINI 0.03*   ------------------------------------------------------------------------------------------------------------------ Invalid input(s): POCBNP  ---------------------------------------------------------------------------------------------------------------  Urinalysis    Component Value Date/Time   COLORURINE AMBER (A) 03/02/2018 0102   APPEARANCEUR HAZY (A) 03/02/2018 0102   LABSPEC 1.043 (H) 03/02/2018 0102   PHURINE 5.0 03/02/2018 0102   GLUCOSEU NEGATIVE 03/02/2018 0102   HGBUR NEGATIVE 03/02/2018 0102   BILIRUBINUR NEGATIVE 03/02/2018 0102   KETONESUR NEGATIVE 03/02/2018 0102   PROTEINUR NEGATIVE 03/02/2018 0102   NITRITE NEGATIVE 03/02/2018 0102   LEUKOCYTESUR NEGATIVE 03/02/2018 0102     RADIOLOGY: Ct Angio Chest Pe W And/or Wo Contrast  Result Date: 03/01/2018 CLINICAL DATA:  Pt arrived  via EMS from  home. Pt reports SOB all day and it wouldn't get better. Reports recent hospitalization for COPD exacerbation. EXAM: CT ANGIOGRAPHY CHEST WITH CONTRAST TECHNIQUE: Multidetector CT imaging of the chest was performed using the standard protocol during bolus administration of intravenous contrast. Multiplanar CT image reconstructions and MIPs were obtained to evaluate the vascular anatomy. CONTRAST:  20mL OMNIPAQUE IOHEXOL 350 MG/ML SOLN COMPARISON:  Chest x-ray on 03/01/2018 FINDINGS: Cardiovascular: Heart size is normal. No pericardial effusion. Coronary artery calcifications are present. Thoracic aortic calcifications are present. No aneurysm or dissection. The pulmonary arteries are well opacified and there is no evidence for acute pulmonary embolus. Mediastinum/Nodes: Esophagus is normal in appearance. No significant mediastinal adenopathy. The visualized portion of the thyroid gland has a normal appearance. Lungs/Pleura: There are emphysematous changes in lungs, primarily within the apices. There are multiple loculated fluid collections within the RIGHT pleural space. The largest of these is identified in the RIGHT LOWER lobe, measuring 6.2 x 10.4 centimeters. There is adjacent atelectatic enhancement associated with these collections. However no split pleural sign to indicate presence of empyema. Within the RIGHT hilar region there is a solid mass, measuring 6.3 x 7.0 x 4.5 centimeters. The adjacent bronchus appears occluded and the findings are suspicious for malignancy. Upper Abdomen: Cholecystectomy. Atherosclerotic calcification of the abdominal aorta. Musculoskeletal: Midthoracic spondylosis. No suspicious lytic or blastic lesions are identified. Review of the MIP images confirms the above findings. IMPRESSION: 1. Technically adequate exam showing no acute pulmonary embolus. 2. RIGHT hilar mass measuring up to 7.0 centimeters suspicious for malignancy. There is occlusion of the adjacent bronchus, suspicious  for endobronchial lesion. Recommend bronchoscopy. 3. Multiple loculated pleural fluid collections, suspicious for malignant effusion. No evidence for empyema. 4.  Emphysema (ICD10-J43.9). 5.  Aortic Atherosclerosis (ICD10-I70.0). Electronically Signed   By: Nolon Nations M.D.   On: 03/01/2018 20:21   Dg Chest Portable 1 View  Result Date: 03/01/2018 CLINICAL DATA:  Hx of COPD, tachy, extreme SOB, breathing treatment in progress, SMokerPt unable to give pertinent hx. EXAM: PORTABLE CHEST 1 VIEW COMPARISON:  None. FINDINGS: There is focal dense opacity in the right upper lung with hazy opacity throughout the remainder of the right lung superimposed on interstitial thickening. There is volume loss on the right relative hyperexpansion of the left lung. Cardiac silhouette is normal in size. No mediastinal or hilar masses. Left lung is clear. Possible small right pleural effusion. No evidence of a left pleural effusion. No pneumothorax. Skeletal structures are intact. IMPRESSION: 1. Abnormal findings in the right lung with upper lobe dense consolidation versus a possible mass, hazy opacity in the remaining right lung superimposed on interstitial thickening. Possible small right pleural effusion. Volume loss on the right. Recommend follow-up chest CT with contrast for further assessment. Electronically Signed   By: Lajean Manes M.D.   On: 03/01/2018 18:45    EKG: Orders placed or performed during the hospital encounter of 03/01/18  . ED EKG  . ED EKG    IMPRESSION AND PLAN:  1.  Acute respiratory failure with hypoxia, likely related to large lung mass, COPD exacerbation and pneumonia.  We will start treatment with IV antibiotics, oxygen, nebulizer, IV steroids.  Pulmonary specialist is consulted for further evaluation and treatment. 2.  New large lung mass, per CTA of the chest. Pulmonary specialist is consulted for further evaluation and treatment. 3.  New onset A. fib with RVR, heart rate improving  on Cardizem drip. 4.  HCAP.  We will start  IV antibiotics, cefepime and vancomycin.  Continue oxygen, nebulizer treatment and IV steroids. 5.  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  We will start IV fluids and monitor kidney function closely.  Avoid nephrotoxic medications. 6.  History of hypertension.  BP is currently on the low side.  We will hold BP meds for now and continue to monitor blood pressure closely. 7.  Acute COPD exacerbation, see treatment as above under #1.  All the records are reviewed and case discussed with ED provider. Management plans discussed with the patient, who is in agreement.  CODE STATUS: DNR    Code Status Orders  (From admission, onward)         Start     Ordered   03/02/18 0227  Do not attempt resuscitation (DNR)  Continuous    Question Answer Comment  In the event of cardiac or respiratory ARREST Do not call a "code blue"   In the event of cardiac or respiratory ARREST Do not perform Intubation, CPR, defibrillation or ACLS   In the event of cardiac or respiratory ARREST Use medication by any route, position, wound care, and other measures to relive pain and suffering. May use oxygen, suction and manual treatment of airway obstruction as needed for comfort.      03/02/18 0226        Code Status History    Date Active Date Inactive Code Status Order ID Comments User Context   12/15/2017 1419 12/18/2017 2059 DNR [id number]  Hillary Bow, MD ED    Advance Directive Documentation     Most Recent Value  Type of Advance Directive  Living will  Pre-existing out of facility DNR order (yellow form or pink MOST form)  -  "MOST" Form in Place?  -       TOTAL TIME TAKING CARE OF THIS PATIENT: 50 minutes.    Amelia Jo M.D on 03/02/2018 at 3:55 AM  Between 7am to 6pm - Pager - [phone]  After 6pm go to [email or web address] - password [email or web address] [facility] [place] [facility]  [phone]  CC: Primary care physician;  Raelyn Mora, MD

## 2018-03-02 NOTE — Consult Note (Signed)
Hematology/Oncology Consult note [facility] [phone] [phone]  Patient Care Team: Raelyn Mora, MD as PCP - General (Internal Medicine)   Name of the patient: Erik Carey  [id number]  19-May-1934   Date of visit: 03/02/18 REASON FOR COSULTATION:   History of presenting illness-  83 y.o. male with PMH listed at below who was sent by EMS to emergency room for evaluation of progressively worsening shortness of breath. Patient has a history of COPD, CVA, residual weakness, hypertension COPD emphysema.  Quit smoking in October 2019. Patient reports having shortness of breath 4 weeks, progressively worsening.  Daughter found patient to be shortness of breath even at resting despite taking inhalers and got concerned.  He also reports having dry cough.  Denies any chest pain or hemoptysis. Patient was found to be hypoxic at 88% per EMS and hypotensive.  With new onset of atrial fibrillation with RVR. Chest x-ray showed lung mass.  CTA of the chest is negative for PE but showed large right hilar mass measuring up to 7 cm, also occluding adjacent bronchus, multiple loculated pleural effusion.  Patient was admitted for further evaluation and treatment. Atrial fibrillation with RVR heart rate improved on Cardizem drip. Patient was also found to have leukocytosis and was currently treated as H CAP on IV antibiotics. He does not use any home oxygen.  Currently breathing comfortably via nasal cannula oxygen.  Also on nebulizing treatment and IV steroids.   Review of Systems  Constitutional: Positive for malaise/fatigue and weight loss.  HENT: Positive for sore throat.   Eyes: Negative for redness.  Respiratory: Positive for cough and shortness of breath.   Cardiovascular: Negative for chest pain and leg swelling.  Gastrointestinal: Negative for abdominal pain and nausea.  Genitourinary: Negative for dysuria.  Musculoskeletal: Negative for  myalgias.  Skin: Negative for rash.  Neurological: Negative for dizziness.  Endo/Heme/Allergies: Does not bruise/bleed easily.  Psychiatric/Behavioral: Negative for hallucinations.    No Known Allergies  Patient Active Problem List   Diagnosis Date Noted  . Lung mass 03/01/2018  . Pressure injury of skin 12/17/2017  . Rhabdomyolysis 12/15/2017     Past Medical History:  Diagnosis Date  . COPD (chronic obstructive pulmonary disease) ([place])   . CVA (cerebral vascular accident) ([place])   . Hypertension   . Left-sided weakness   . Slurred speech   . Tobacco use      History reviewed. No pertinent surgical history.  Social History   Socioeconomic History  . Marital status: Single    Spouse name: Not on file  . Number of children: Not on file  . Years of education: Not on file  . Highest education level: Not on file  Occupational History  . Not on file  Social Needs  . Financial resource strain: Not very hard  . Food insecurity:    Worry: Patient refused    Inability: Patient refused  . Transportation needs:    Medical: Patient refused    Non-medical: Patient refused  Tobacco Use  . Smoking status: Former Smoker    Last attempt to quit: 11/29/2017    Years since quitting: 0.2  . Smokeless tobacco: Never Used  Substance and Sexual Activity  . Alcohol use: Not Currently  . Drug use: Not Currently  . Sexual activity: Not Currently  Lifestyle  . Physical activity:    Days per week: Patient refused    Minutes per session: Patient refused  . Stress: Only a little  Relationships  . Social connections:    Talks on phone: Patient refused    Gets together: Patient refused    Attends religious service: Patient refused    Active member of club or organization: Patient refused    Attends meetings of clubs or organizations: Patient refused    Relationship status: Patient refused  . Intimate partner violence:    Fear of current or ex partner: Patient refused     Emotionally abused: Patient refused    Physically abused: Patient refused    Forced sexual activity: Patient refused  Other Topics Concern  . Not on file  Social History Narrative  . Not on file     Family History  Problem Relation Age of Onset  . Tuberculosis Mother      Current Facility-Administered Medications:  .  acetaminophen (TYLENOL) tablet 650 mg, 650 mg, Oral, Q6H PRN **OR** acetaminophen (TYLENOL) suppository 650 mg, 650 mg, Rectal, Q6H PRN, Amelia Jo, MD .  albuterol (PROVENTIL) (2.5 MG/3ML) 0.083% nebulizer solution 2.5 mg, 2.5 mg, Inhalation, Q6H PRN, Amelia Jo, MD .  allopurinol (ZYLOPRIM) tablet 400 mg, 400 mg, Oral, Daily, Amelia Jo, MD, 400 mg at 03/02/18 0916 .  bisacodyl (DULCOLAX) EC tablet 5 mg, 5 mg, Oral, Daily PRN, Amelia Jo, MD .  ceFEPIme (MAXIPIME) 2 g in sodium chloride 0.9 % 100 mL IVPB, 2 g, Intravenous, Q12H, Patel, Kishan S, RPH .  diltiazem (CARDIZEM CD) 24 hr capsule 180 mg, 180 mg, Oral, Daily, Amelia Jo, MD, 180 mg at 03/02/18 0916 .  docusate sodium (COLACE) capsule 100 mg, 100 mg, Oral, BID, Amelia Jo, MD, 100 mg at 03/02/18 0917 .  HYDROcodone-acetaminophen (NORCO/VICODIN) 5-325 MG per tablet 1-2 tablet, 1-2 tablet, Oral, Q4H PRN, Amelia Jo, MD .  ipratropium-albuterol (DUONEB) 0.5-2.5 (3) MG/3ML nebulizer solution 3 mL, 3 mL, Nebulization, Q6H, Amelia Jo, MD, 3 mL at 03/02/18 0813 .  methylPREDNISolone sodium succinate (SOLU-MEDROL) 40 mg/mL injection 40 mg, 40 mg, Intravenous, Q12H, Wieting, Richard, MD .  ondansetron (ZOFRAN) tablet 4 mg, 4 mg, Oral, Q6H PRN **OR** ondansetron (ZOFRAN) injection 4 mg, 4 mg, Intravenous, Q6H PRN, Amelia Jo, MD .  pantoprazole (PROTONIX) EC tablet 40 mg, 40 mg, Oral, QHS, Amelia Jo, MD .  tamsulosin [facility]) capsule 0.4 mg, 0.4 mg, Oral, Daily, Amelia Jo, MD, 0.4 mg at 03/02/18 0916 .  traZODone (DESYREL) tablet 25 mg, 25 mg, Oral, QHS PRN, Amelia Jo, MD .   vancomycin (VANCOCIN) 1,250 mg in sodium chloride 0.9 % 250 mL IVPB, 1,250 mg, Intravenous, Q24H, Amelia Jo, MD, Stopped at 03/02/18 0538   Physical exam:  Vitals:   03/02/18 0000 03/02/18 0030 03/02/18 0226 03/02/18 0813  BP: (!) 94/58 138/65 99/61   Pulse: 74 91 80 83  Resp: (!) 24 (!) 24  20  Temp:   97.8 F (36.6 C)   TempSrc:   Oral   SpO2: 94% 92% 93% 92%  Weight:   207 lb 9.6 oz (94.2 kg)   Height:   5\' 9"  (1.753 m)    Physical Exam  Constitutional: He is oriented to person, place, and time. No distress.  HENT:  Head: Normocephalic and atraumatic.  Nose: Nose normal.  Mouth/Throat: Oropharynx is clear and moist. No oropharyngeal exudate.  Eyes: Pupils are equal, round, and reactive to light. EOM are normal. No scleral icterus.  Neck: Normal range of motion. Neck supple.  Cardiovascular: Normal rate and regular rhythm.  No murmur heard. Pulmonary/Chest: Effort normal. No respiratory distress. He  has no rales.  Breathing comfortably via nasal cannula oxygen. Decreased breath sound on the right.  Abdominal: Soft. He exhibits no distension. There is no tenderness.  Musculoskeletal: Normal range of motion. He exhibits no edema.  Neurological: He is alert and oriented to person, place, and time. He exhibits normal muscle tone.  Skin: Skin is warm and dry. He is not diaphoretic. No erythema.  Psychiatric: Affect normal.        CMP Latest Ref Rng & Units 03/02/2018  Glucose 70 - 99 mg/dL 165(H)  BUN 8 - 23 mg/dL 60(H)  Creatinine 0.61 - 1.24 mg/dL 1.48(H)  Sodium 135 - 145 mmol/L 139  Potassium 3.5 - 5.1 mmol/L 3.7  Chloride 98 - 111 mmol/L 108  CO2 22 - 32 mmol/L 21(L)  Calcium 8.9 - 10.3 mg/dL 8.0(L)  Total Protein 6.5 - 8.1 g/dL -  Total Bilirubin 0.3 - 1.2 mg/dL -  Alkaline Phos 38 - 126 U/L -  AST 15 - 41 U/L -  ALT 0 - 44 U/L -   CBC Latest Ref Rng & Units 03/02/2018  WBC 4.0 - 10.5 K/uL 26.2(H)  Hemoglobin 13.0 - 17.0 g/dL 10.2(L)  Hematocrit 39.0 -  52.0 % 31.1(L)  Platelets 150 - 400 K/uL 323   RADIOGRAPHIC STUDIES: I have personally reviewed the radiological images as listed and agreed with the findings in the report. Ct Angio Chest Pe W And/or Wo Contrast  Result Date: 03/01/2018 CLINICAL DATA:  Pt arrived via EMS from home. Pt reports SOB all day and it wouldn't get better. Reports recent hospitalization for COPD exacerbation. EXAM: CT ANGIOGRAPHY CHEST WITH CONTRAST TECHNIQUE: Multidetector CT imaging of the chest was performed using the standard protocol during bolus administration of intravenous contrast. Multiplanar CT image reconstructions and MIPs were obtained to evaluate the vascular anatomy. CONTRAST:  23mL OMNIPAQUE IOHEXOL 350 MG/ML SOLN COMPARISON:  Chest x-ray on 03/01/2018 FINDINGS: Cardiovascular: Heart size is normal. No pericardial effusion. Coronary artery calcifications are present. Thoracic aortic calcifications are present. No aneurysm or dissection. The pulmonary arteries are well opacified and there is no evidence for acute pulmonary embolus. Mediastinum/Nodes: Esophagus is normal in appearance. No significant mediastinal adenopathy. The visualized portion of the thyroid gland has a normal appearance. Lungs/Pleura: There are emphysematous changes in lungs, primarily within the apices. There are multiple loculated fluid collections within the RIGHT pleural space. The largest of these is identified in the RIGHT LOWER lobe, measuring 6.2 x 10.4 centimeters. There is adjacent atelectatic enhancement associated with these collections. However no split pleural sign to indicate presence of empyema. Within the RIGHT hilar region there is a solid mass, measuring 6.3 x 7.0 x 4.5 centimeters. The adjacent bronchus appears occluded and the findings are suspicious for malignancy. Upper Abdomen: Cholecystectomy. Atherosclerotic calcification of the abdominal aorta. Musculoskeletal: Midthoracic spondylosis. No suspicious lytic or blastic  lesions are identified. Review of the MIP images confirms the above findings. IMPRESSION: 1. Technically adequate exam showing no acute pulmonary embolus. 2. RIGHT hilar mass measuring up to 7.0 centimeters suspicious for malignancy. There is occlusion of the adjacent bronchus, suspicious for endobronchial lesion. Recommend bronchoscopy. 3. Multiple loculated pleural fluid collections, suspicious for malignant effusion. No evidence for empyema. 4.  Emphysema (ICD10-J43.9). 5.  Aortic Atherosclerosis (ICD10-I70.0). Electronically Signed   By: Nolon Nations M.D.   On: 03/01/2018 20:21   Dg Chest Portable 1 View  Result Date: 03/01/2018 CLINICAL DATA:  Hx of COPD, tachy, extreme SOB, breathing treatment in progress, SMokerPt unable  to give pertinent hx. EXAM: PORTABLE CHEST 1 VIEW COMPARISON:  None. FINDINGS: There is focal dense opacity in the right upper lung with hazy opacity throughout the remainder of the right lung superimposed on interstitial thickening. There is volume loss on the right relative hyperexpansion of the left lung. Cardiac silhouette is normal in size. No mediastinal or hilar masses. Left lung is clear. Possible small right pleural effusion. No evidence of a left pleural effusion. No pneumothorax. Skeletal structures are intact. IMPRESSION: 1. Abnormal findings in the right lung with upper lobe dense consolidation versus a possible mass, hazy opacity in the remaining right lung superimposed on interstitial thickening. Possible small right pleural effusion. Volume loss on the right. Recommend follow-up chest CT with contrast for further assessment. Electronically Signed   By: Lajean Manes M.D.   On: 03/01/2018 18:45    Assessment and plan- Patient is a 83 y.o. male with history of CVA, left-sided weakness, history of tobacco use, COPD emphysema was sent via EMS for progressively worsening shortness of breath.  Image work-up including chest x-ray and CT chest showed right hilar mass  measuring up to 7 cm suspicious for malignancy.  There is also occlusion of adjacent bronchus suspicious for endobronchial lesion.  Multiple loculated pleural fluid  #Right hilar mass with bronchus occlusion and multiple loculated pleural fluid Highly suspicious for primary lung cancer.  Agree with pulmonology consultation for evaluation of bronchoscopy biopsy of hilar mass and/or endobonchial tumor if presents. Consider thoracentesis and fluid work up to further improve breathing status. #Leukocytosis, with above CT findings. likely superimposed pneumonia.  Agree with treating HCAP with empiric antibiotics. Blood culture pending.  # Anemia,normocytic. Decreased 2 grams of hemoglobin comparing to baseline. Continue to monitor, outpatient work up.   We spent sufficient time to discuss many aspect of care, daughter has many questions and all questions were answered to her satisfaction.   Thank you for allowing me to participate in the care of this patient.  Total face to face encounter time for this patient visit was 70 min. >50% of the time was  spent in counseling and coordination of care.    Earlie Server, MD, PhD Hematology Oncology [facility] Pager- [phone] 03/02/2018

## 2018-03-02 NOTE — Consult Note (Signed)
Erik CONSULT NOTE  Requesting MD/Service: Leslye Peer Date of initial consultation: 03/02/18 Reason for consultation: Multiloculated R pleural Carey, Erik R lung mass  PT PROFILE: 83 y.o. male former smoker adm to [facility] 03/01/2018 with several days of cough and increasing SOB.  Hospitalized August 2019 with weakness after an apparent fall.  No CXR was performed during that Carey  DATA: 03/01/18 CTA chest: No Erik embolism.  Findings of moderate emphysema, predominantly in upper lobes.  Multiple loculated pleural fluid collections on the R.  Official report indicates 7 cm right perihilar mass.  My interpretation: Airway obstruction of posterior segment of RUL with Erik small mass and postobstructive pneumonia/atelectasis.   INTERVAL:  HPI:  As above.  At his baseline, he requires assistance with ADLs.  He provides a reliable history.  He denies documented fever and also subjective fevers or chills.  He denies purulent sputum and hemoptysis.  His cough has been nonproductive.  He denies pleuritic chest pain.  There is been no unexplained weight loss.  He is a former heavy smoker but has not smoked since his Carey in August.  Past Medical History:  Diagnosis Date  . COPD (chronic obstructive Erik disease) ([place])   . CVA (cerebral vascular accident) ([place])   . Hypertension   . Left-sided weakness   . Slurred speech   . Tobacco use     History reviewed. No pertinent surgical history.  MEDICATIONS: I have reviewed all medications and confirmed regimen as documented  Social History   Socioeconomic History  . Marital status: Single    Spouse name: Not on file  . Number of children: Not on file  . Years of education: Not on file  . Highest education level: Not on file  Occupational History  . Not on file  Social Needs  . Financial resource strain: Not very hard  . Food insecurity:    Worry: Patient refused    Inability: Patient refused  .  Transportation needs:    Medical: Patient refused    Non-medical: Patient refused  Tobacco Use  . Smoking status: Former Smoker    Last attempt to quit: 11/29/2017    Years since quitting: 0.2  . Smokeless tobacco: Never Used  Substance and Sexual Activity  . Alcohol use: Not Currently  . Drug use: Not Currently  . Sexual activity: Not Currently  Lifestyle  . Physical activity:    Days per week: Patient refused    Minutes per session: Patient refused  . Stress: Only a little  Relationships  . Social connections:    Talks on phone: Patient refused    Gets together: Patient refused    Attends religious service: Patient refused    Active member of club or organization: Patient refused    Attends meetings of clubs or organizations: Patient refused    Relationship status: Patient refused  . Intimate partner violence:    Fear of current or ex partner: Patient refused    Emotionally abused: Patient refused    Physically abused: Patient refused    Forced sexual activity: Patient refused  Other Topics Concern  . Not on file  Social History Narrative  . Not on file    Family History  Problem Relation Age of Onset  . Tuberculosis Mother     ROS: No fever, myalgias/arthralgias, unexplained weight loss or weight gain No new focal weakness or sensory deficits No otalgia, hearing loss, visual changes, nasal and sinus symptoms, mouth and throat problems No neck pain or adenopathy  No abdominal pain, N/V/D, diarrhea, change in bowel pattern No dysuria, change in urinary pattern   Vitals:   03/02/18 0813 03/02/18 1418 03/02/18 1441 03/02/18 1446  BP:  (!) 120/54 (!) 101/46 110/67  Pulse: 83 81 77 78  Resp: 20     Temp:      TempSrc:      SpO2: 92% 95% 95% 94%  Weight:      Height:         EXAM:  Gen: Chronically ill-appearing, No overt respiratory distress HEENT: NCAT, sclera white, oropharynx normal Neck: Supple without LAN, thyromegaly.  Full beard limits assessment of  JVP Lungs: breath sounds minutes throughout and particularly on R, bibasilar crackles more prominent on the R, no wheezes or rhonchi noted. Cardiovascular: RRR, no murmurs noted Abdomen: Soft, nontender, normal BS Ext: without clubbing, cyanosis, edema Neuro: CNs grossly intact, motor and sensory intact Skin: Limited exam, no lesions noted  DATA:   BMP Latest Ref Rng & Units 03/02/2018 03/01/2018 12/18/2017  Glucose 70 - 99 mg/dL 165(H) 139(H) 124(H)  BUN 8 - 23 mg/dL 60(H) 68(H) 31(H)  Creatinine 0.61 - 1.24 mg/dL 1.48(H) 1.75(H) 1.33(H)  Sodium 135 - 145 mmol/L 139 140 144  Potassium 3.5 - 5.1 mmol/L 3.7 3.7 4.3  Chloride 98 - 111 mmol/L 108 105 112(H)  CO2 22 - 32 mmol/L 21(L) 21(L) 25  Calcium 8.9 - 10.3 mg/dL 8.0(L) 8.2(L) 8.4(L)    CBC Latest Ref Rng & Units 03/02/2018 03/01/2018 12/16/2017  WBC 4.0 - 10.5 K/uL 26.2(H) 37.1(H) 12.2(H)  Hemoglobin 13.0 - 17.0 g/dL 10.2(L) 12.0(L) 13.8  Hematocrit 39.0 - 52.0 % 31.1(L) 36.3(L) 39.3(L)  Platelets 150 - 400 K/uL 323 392 148(L)    CXR 10/27:  Abnormal findings in the right lung with upper lobe dense consolidation versus a Erik mass, hazy opacity in the remaining right lung superimposed on interstitial thickening. Erik small right pleural Carey. Volume loss on the right. Recommend follow-up chest CT with contrast for further assessment  I have personally reviewed all chest radiographs reported above including CXRs and CT chest unless otherwise indicated  IMPRESSION:   1) COPD with findings of moderate emphysema on CT scan 2) acute hypoxemic respiratory failure 3) multiloculated R pleural Carey 4) airway cutoff sign in airway to posterior segment of RUL 5) Erik RUL mass 6) postobstructive pneumonia/atelectasis  This is a very difficult CT chest to interpret.  I do not believe that he has a 7 cm mass in the perihilar region/RUL.  I think much of what is identified as mass on the official interpretation of the CT  chest represents postobstructive pneumonia/atelectasis.  However, the airway cutoff sign suggests an endobronchial tumor/obstruction.  With regard to the multiloculated pleural Carey, I am concerned about the possibility of malignant versus infectious causes.  Less likely, given his history of falls, could be hemothorax related to trauma.  However, he denies falling and striking the right side of his chest.   PLAN:  I discussed with interventional radiology and requested thoracentesis of the dominant loculation.  I have placed orders for tests to be performed on the pleural Carey including chemistries, cell count, Gram stain, bacterial culture, cytology.  I have discussed with the patient and his daughter bronchoscopy to evaluate his airways, in particular RUL.  They understand that if an endobronchial tumor is noted, biopsy will be performed.  If no endobronchial tumors noted, I will obtain cytology brushings and washings from the posterior segment of the RUL  Continue current antibiotics for now   Merton Border, MD PCCM service Mobile [phone] Pager [phone] 03/02/2018 3:39 PM

## 2018-03-02 NOTE — Progress Notes (Signed)
PT Cancellation Note  Patient Details Name: Erik Carey MRN: [id number] DOB: 10/16/1934   Cancelled Treatment:    Reason Eval/Treat Not Completed: Patient at procedure or test/unavailable.  PT consult received.  Chart reviewed.  Pt currently not in room (nurse reporting plan for thoracentesis today and another procedure tomorrow).  Will re-attempt PT evaluation at a later date/time.  Leitha Bleak, PT 03/02/18, 2:25 PM [phone]

## 2018-03-03 ENCOUNTER — Encounter: Payer: Self-pay | Admitting: Certified Registered"

## 2018-03-03 ENCOUNTER — Encounter
Admission: EM | Disposition: A | Discharge status: Other Acute Inpatient Hospital | Payer: Self-pay | Source: Home / Self Care | Attending: Internal Medicine

## 2018-03-03 DIAGNOSIS — J918 Pleural effusion in other conditions classified elsewhere: Secondary | ICD-10-CM

## 2018-03-03 DIAGNOSIS — J189 Pneumonia, unspecified organism: Secondary | ICD-10-CM

## 2018-03-03 HISTORY — PX: FLEXIBLE BRONCHOSCOPY: SHX5094

## 2018-03-03 LAB — CBC
HCT: 28.8 % — ABNORMAL LOW (ref 39.0–52.0)
Hemoglobin: 9.2 g/dL — ABNORMAL LOW (ref 13.0–17.0)
MCH: 30.7 pg (ref 26.0–34.0)
MCHC: 31.9 g/dL (ref 30.0–36.0)
MCV: 96 fL (ref 80.0–100.0)
PLATELETS: 273 10*3/uL (ref 150–400)
RBC: 3 MIL/uL — ABNORMAL LOW (ref 4.22–5.81)
RDW: 15 % (ref 11.5–15.5)
WBC: 21.2 10*3/uL — AB (ref 4.0–10.5)
nRBC: 0 % (ref 0.0–0.2)

## 2018-03-03 LAB — BASIC METABOLIC PANEL
Anion gap: 7 (ref 5–15)
BUN: 57 mg/dL — ABNORMAL HIGH (ref 8–23)
CALCIUM: 8.2 mg/dL — AB (ref 8.9–10.3)
CO2: 23 mmol/L (ref 22–32)
CREATININE: 1.3 mg/dL — AB (ref 0.61–1.24)
Chloride: 110 mmol/L (ref 98–111)
GFR calc non Af Amer: 50 mL/min — ABNORMAL LOW (ref >60)
GFR, EST AFRICAN AMERICAN: 58 mL/min — AB (ref >60)
Glucose, Bld: 160 mg/dL — ABNORMAL HIGH (ref 70–99)
Potassium: 3.5 mmol/L (ref 3.5–5.1)
SODIUM: 140 mmol/L (ref 135–145)

## 2018-03-03 LAB — BODY FLUID CELL COUNT WITH DIFFERENTIAL
EOS FL: 0 %
LYMPHS FL: 21 %
MONOCYTE-MACROPHAGE-SEROUS FLUID: 0 %
Neutrophil Count, Fluid: 79 %
OTHER CELLS FL: 0 %
Total Nucleated Cell Count, Fluid: 19409 cu mm

## 2018-03-03 LAB — GLUCOSE, CAPILLARY: GLUCOSE-CAPILLARY: 126 mg/dL — AB (ref 70–99)

## 2018-03-03 SURGERY — BRONCHOSCOPY, FLEXIBLE
Anesthesia: Moderate Sedation

## 2018-03-03 MED ORDER — IPRATROPIUM-ALBUTEROL 0.5-2.5 (3) MG/3ML IN SOLN: 3.0000 mL | RESPIRATORY_TRACT | Status: DC | PRN

## 2018-03-03 MED ORDER — SODIUM CHLORIDE 0.9 % IV SOLN
3.0000 g | Freq: Four times a day (QID) | INTRAVENOUS | Status: DC
  Administered 2018-03-03 – 2018-03-08 (×22): 3 g via INTRAVENOUS
  Filled 2018-03-03 (×25): qty 3

## 2018-03-03 MED ORDER — MIDAZOLAM HCL 2 MG/2ML IJ SOLN
INTRAMUSCULAR | Status: AC
  Filled 2018-03-03: qty 6

## 2018-03-03 MED ORDER — SODIUM CHLORIDE 0.9 % IV SOLN
INTRAVENOUS | Status: DC | PRN
  Administered 2018-03-03: 03:00:00 500 mL via INTRAVENOUS
  Administered 2018-03-07 – 2018-03-08 (×2): 250 mL via INTRAVENOUS

## 2018-03-03 MED ORDER — FENTANYL CITRATE (PF) 100 MCG/2ML IJ SOLN
INTRAMUSCULAR | Status: AC
  Filled 2018-03-03: qty 6

## 2018-03-03 NOTE — Care Management Note (Signed)
Case Management Note  Patient Details  Name: Erik Carey MRN: [id number] Date of Birth: 04/15/1935  Subjective/Objective:   Admitted to [facility] with the diagnosis of lung mass. Lives alone. [facility] to [facility] [address] [facility] [place]. States he does and has received services in the home from [place].  Uses a rolling walker to aid in ambulation.  Discussed transferring to [facility].  States he doesn't want to make this decision,. He would like his daughter Erik Carey to make this decision when she returns from [place] in the morning                   Action/Plan: Will discuss transfer with daughter Erik Carey. Will update [employer]   Expected Discharge Date:                  Expected Discharge Plan:     In-House Referral:   yes  Discharge planning Services   yes  Post Acute Care Choice:   yes Choice offered to:   Patient and daughter  DME Arranged:    DME Agency:     HH Arranged:    [place] Agency:     Status of Service:     If discussed at [employer], dates discussed:    Additional Comments:  Shelbie Ammons, RN MSN CCM Care Management [phone] 03/03/2018, 4:02 PM

## 2018-03-03 NOTE — Progress Notes (Signed)
Patient ID: Erik Carey, male   DOB: [birth year], 83 y.o.   MRN: [id number]  Chief Complaint  Patient presents with  . Shortness of Breath  . Respiratory Distress  . Atrial Fibrillation    Referred By Erik Carey Reason for Referral complicated right parapneumonic effusion  HPI Location, Quality, Duration, Severity, Timing, Context, Modifying Factors, Associated Signs and Symptoms.  Erik Carey is a 83 y.o. male.  I was asked to see this gentleman today regarding his complicated right parapneumonic effusion.  I discussed his care with Erik Carey and Erik Carey.  I interviewed the patient in the presence of his daughter.  He is an 35 year old gentleman who has had declining health over the last 6 months or so but recently had an acute exacerbation of his shortness of breath and chest pain and presented to our emergency department.  Once here he was found to be quite hypoxic and had a chest x-ray and CT scan which showed a probable mass in the right upper lung with a complicated parapneumonic effusion.  There were multiple pockets of fluid present.  He underwent a thoracentesis and drainage of 1 of the larger pockets of fluid.  That was suspicious for a empyema.  The patient then had a bronchoscopy done today by Dr. Jamal Carey.  That revealed a possible endobronchial lesion in the posterior segment of the right upper lobe.  However the bronchoscopy was difficult and that data is uncertain at this time.  The patient states that he has had a stroke about 15 years ago.  He lives alone at home but his daughter is a frequent visitor to him and helps him with his activities of daily living.  He gets very short of breath with limited exercise.  He is unable to care much for himself at home.  He states that he has been unable to get out of bed to perform any exercise over the last several months.  He receives almost all of his care at the dorm [place].  He had a stroke and had a carotid  endarterectomy done there but has had no other cardiovascular procedures.  He has had no lung surgery.  He is a lifelong smoker but quit 3 months ago.   Past Medical History:  Diagnosis Date  . COPD (chronic obstructive pulmonary disease) ([place])   . CVA (cerebral vascular accident) ([place])   . Hypertension   . Left-sided weakness   . Slurred speech   . Tobacco use     History reviewed. No pertinent surgical history.  Family History  Problem Relation Age of Onset  . Tuberculosis Mother     Social History Social History   Tobacco Use  . Smoking status: Former Smoker    Last attempt to quit: 11/29/2017    Years since quitting: 0.2  . Smokeless tobacco: Never Used  Substance Use Topics  . Alcohol use: Not Currently  . Drug use: Not Currently    No Known Allergies  Current Facility-Administered Medications  Medication Dose Route Frequency Provider Last Rate Last Dose  . 0.9 %  sodium chloride infusion   Intravenous PRN Arta Silence, MD   Stopped at 03/03/18 0330  . acetaminophen (TYLENOL) tablet 650 mg  650 mg Oral Q6H PRN Erik Jo, MD       Or  . acetaminophen (TYLENOL) suppository 650 mg  650 mg Rectal Q6H PRN Erik Jo, MD      . allopurinol (ZYLOPRIM) tablet 400 mg  400 mg Oral Daily Erik Jo, MD   400 mg at 03/03/18 1228  . Ampicillin-Sulbactam (UNASYN) 3 g in sodium chloride 0.9 % 100 mL IVPB  3 g Intravenous Q6H Erik Carey, RPH      . bisacodyl (DULCOLAX) EC tablet 5 mg  5 mg Oral Daily PRN Erik Jo, MD      . diltiazem (CARDIZEM CD) 24 hr capsule 180 mg  180 mg Oral Daily Erik Jo, MD   180 mg at 03/03/18 1228  . docusate sodium (COLACE) capsule 100 mg  100 mg Oral BID Erik Jo, MD   100 mg at 03/03/18 1228  . fentaNYL (SUBLIMAZE) 100 MCG/2ML injection           . HYDROcodone-acetaminophen (NORCO/VICODIN) 5-325 MG per tablet 1-2 tablet  1-2 tablet Oral Q4H PRN Erik Jo, MD      . ipratropium-albuterol (DUONEB) 0.5-2.5 (3)  MG/3ML nebulizer solution 3 mL  3 mL Nebulization Q4H PRN Erik Mcardle, MD      . midazolam (VERSED) 2 MG/2ML injection           . ondansetron (ZOFRAN) tablet 4 mg  4 mg Oral Q6H PRN Erik Jo, MD       Or  . ondansetron [facility]) injection 4 mg  4 mg Intravenous Q6H PRN Erik Jo, MD      . pantoprazole (PROTONIX) EC tablet 40 mg  40 mg Oral QHS Erik Jo, MD   40 mg at 03/02/18 2211  . tamsulosin (FLOMAX) capsule 0.4 mg  0.4 mg Oral Daily Erik Jo, MD   0.4 mg at 03/03/18 1228  . traZODone (DESYREL) tablet 25 mg  25 mg Oral QHS PRN Erik Jo, MD   25 mg at 03/02/18 2211      Review of Systems A complete review of systems was asked and was negative except for the following positive findings shortness of breath.  Poor physical conditioning.  Blood pressure 132/70, pulse 84, temperature (!) 97.4 F (36.3 C), temperature source Oral, resp. rate 20, height 5\' 9"  (1.753 m), weight 94.2 kg, SpO2 96 %.  Physical Exam CONSTITUTIONAL:  Pleasant, well-developed, well-nourished, and in no acute distress.  He did become quite dyspneic with minimal activities such as sitting at the side of the bed. EYES: Pupils equal and reactive to light, Sclera non-icteric EARS, NOSE, MOUTH AND THROAT:  The oropharynx was clear.  Dentition is good repair.  Oral mucosa pink and moist. LYMPH NODES:  Lymph nodes in the neck and axillae were normal RESPIRATORY:  Lungs exam revealed scattered wheezes throughout.  There is diminished breath sounds at the right upper posterior chest..  Normal respiratory effort without pathologic use of accessory muscles of respiration CARDIOVASCULAR: Heart was regular without murmurs.  There were no carotid bruits. GI: The abdomen was soft, nontender, and nondistended. There were no palpable masses. There was no hepatosplenomegaly. There were normal bowel sounds in all quadrants. GU:  Rectal deferred.   MUSCULOSKELETAL:  Normal muscle strength and tone.  No  clubbing or cyanosis.   SKIN:  There were no pathologic skin lesions.  There were no nodules on palpation. NEUROLOGIC:  Sensation is normal.  Cranial nerves are grossly intact. PSYCH:  Oriented to person, place and time.  Mood and affect are normal.  Data Reviewed CT scan and chest x-ray  I have personally reviewed the patient's imaging, laboratory findings and medical records.    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  Overall he is a very poor surgical candidate.  I explained to them that a thoracoscopy may be of benefit if he could accomplish drainage of the multiple pockets of fluid.  However given the fluid analysis and empyema certainly may be possible and he may require a thoracotomy.  In the setting of a endobronchial malignancy a thoracotomy for an empyema certainly would be less than desirable.  In addition his overall condition is such that a thoracotomy would be a prohibitive risk to him.  At the present time they would like to wait until they get the results of the bronchoscopy back and we can determine for sure what the endobronchial anatomy and pathology is.  I also explained to them that an option would be to let him go back to the dorm [place] where he can be managed by the [place] physicians as he gets all of his care there and they have his records.  They would consider doing that.       Nestor Lewandowsky, MD 03/03/2018, 2:01 PM   Patient ID: Johney Frame, male   DOB: [birth year], 83 y.o.   MRN: [id number]

## 2018-03-03 NOTE — Progress Notes (Signed)
PULMONARY PROGRESS NOTE  Requesting MD/Service: Leslye Peer Date of initial consultation: 03/02/18 Reason for consultation: Multiloculated R pleural effusion, possible R lung mass  PT PROFILE: 83 y.o. male former smoker adm to [facility] 03/01/2018 with several days of cough and increasing SOB.  Hospitalized August 2019 with weakness after an apparent fall.  No CXR was performed during that hospitalization  DATA: 03/01/18 CTA chest: No pulmonary embolism.  Findings of moderate emphysema, predominantly in upper lobes.  Multiple loculated pleural fluid collections on the R.  Official report indicates 7 cm right perihilar mass.  My interpretation: Airway obstruction of posterior segment of RUL with possible small mass and postobstructive pneumonia/atelectasis. 03/01/18 Thoracentesis: 300 cc clear yellow fluid removed. LDH 5970. WBC 19,409. 79% neutrophils. Few GPC on Gram stain 03/02/18 Bronchoscopy: possible obstructing tumor in posterior segment of RUL - difficult visualization. Washings, brushings, EBBx obtained  INTERVAL:  SUBJ: Post bronchoscopy check.  He is doing well with no respiratory distress.  He has minimal recall of the procedure.  He has minimal cough and no new complaints.  He denies chest pain.  OBJ: Vitals:   03/03/18 1050 03/03/18 1055 03/03/18 1103 03/03/18 1240  BP: 119/64 120/76 99/64 132/70  Pulse: (!) 101 94 92 84  Resp: 20 (!) 22 20 20   Temp:    (!) 97.4 F (36.3 C)  TempSrc:    Oral  SpO2: 91% 91% 92% 96%  Weight:      Height:         EXAM:  Gen: NAD HEENT: WNL Lungs: No wheezes Cardiovascular: Regular, no M Abdomen: Soft, NT, NABS Ext: No C/C/E Neuro: No new findings  DATA:   BMP Latest Ref Rng & Units 03/03/2018 03/02/2018 03/01/2018  Glucose 70 - 99 mg/dL 160(H) 165(H) 139(H)  BUN 8 - 23 mg/dL 57(H) 60(H) 68(H)  Creatinine 0.61 - 1.24 mg/dL 1.30(H) 1.48(H) 1.75(H)  Sodium 135 - 145 mmol/L 140 139 140  Potassium 3.5 - 5.1 mmol/L 3.5 3.7 3.7  Chloride  98 - 111 mmol/L 110 108 105  CO2 22 - 32 mmol/L 23 21(L) 21(L)  Calcium 8.9 - 10.3 mg/dL 8.2(L) 8.0(L) 8.2(L)    CBC Latest Ref Rng & Units 03/03/2018 03/02/2018 03/01/2018  WBC 4.0 - 10.5 K/uL 21.2(H) 26.2(H) 37.1(H)  Hemoglobin 13.0 - 17.0 g/dL 9.2(L) 10.2(L) 12.0(L)  Hematocrit 39.0 - 52.0 % 28.8(L) 31.1(L) 36.3(L)  Platelets 150 - 400 K/uL 273 323 392    CXR 10/28 (postthoracentesis): Persistent pleural-based opacity in RUL.  Hazy, groundglass opacity in R lung base, improved  PLEURAL FLUID: LDH 5970, WBC 19k - neutrophil predominant  I have personally reviewed all chest radiographs reported above including CXRs and CT chest unless otherwise indicated  IMPRESSION:   1) COPD with findings of moderate emphysema on CT scan 2) acute hypoxemic respiratory failure 3) probable lung tumor with obstruction of posterior segment of RUL 4) probable postobstructive pneumonia 5) complicated parapneumonic effusion  Although it was not clearly visualized, based on the CT scan and bronchoscopy findings I do believe he has endobronchial obstruction involving the airway to the posterior segment of the RUL.  I think most of the other findings in the RUL represent postobstructive pneumonia.  Pleural fluid analysis is most consistent with a complicated parapneumonic effusion although it is possible that it is also a malignant effusion.  Successful treatment of the complicated pleural effusion is not likely possible without more complete drainage of the pleural space  PLAN:  Discontinue vancomycin, cefepime Unasyn initiated.  Duration of antibiotic therapy to be determined Discontinue systemic steroids Change bronchodilators to as needed only Thoracic surgery consultation requested.  I discussed with Dr. Genevive Bi  I reviewed the bronchoscopy findings in detail with the patient and his daughter.  We also discussed the rest of the plan as outlined above.   Merton Border, MD PCCM service Mobile  [phone] Pager [phone] 03/03/2018 1:00 PM

## 2018-03-03 NOTE — Evaluation (Signed)
Physical Therapy Evaluation Patient Details Name: Erik Carey MRN: [id number] DOB: [birth year] Today's Date: 03/03/2018   History of Present Illness  Pt is an 83 y.o. male presenting to hospital 03/01/18 with SOB, respiratory distress, hypoxia, and a-fib with RVR.  Pt admitted with acute respiratory failure with hypoxia, COPD exacerbation, PNA, possible obstructing tumor in posterior segment of RUL, and new onset a-fib with RVR.  PMH includes COPD, CVA, htn, and L sided weakness.  Clinical Impression  Prior to hospital admission, pt reports being modified independent with bed mobility and transferring to manual w/c with RW.  Pt reports he will only get OOB when he has the energy/strength, and is able to propel self modified independently in manual w/c when he has the energy/strength.  Pt's daughter stops in almost every day to assist and has [facility] days a week (3 hrs each time) to assist with ADL's.  Pt lives alone in 1 level home with 3 steps with L railing to enter.  Pt appearing oriented but some general confusion noted during session.  Currently pt is SBA semi-supine to sit and CGA with standing and taking steps bed to recliner with RW.  Increased effort and time noted to perform functional activities and increased SOB noted (O2 sats 94% or greater on 2 L O2 via nasal cannula during session).  Pt would benefit from skilled PT to address noted impairments and functional limitations (see below for any additional details).  Pt appears close to self reported recent functional mobility abilities.  Upon hospital discharge, recommend pt discharge to home with HHPT; also would recommend increased services within the home (CM notified).    Follow Up Recommendations Home health PT;Supervision - Intermittent    Equipment Recommendations  Rolling walker with 5" wheels;Wheelchair (measurements PT);Wheelchair cushion (measurements PT)    Recommendations for Other Services OT consult     Precautions /  Restrictions Precautions Precautions: Fall Precaution Comments: Monitor O2 Restrictions Weight Bearing Restrictions: No      Mobility  Bed Mobility Overal bed mobility: Needs Assistance Bed Mobility: Supine to Sit     Supine to sit: Supervision;HOB elevated     General bed mobility comments: increased effort and time for pt to perform on own  Transfers Overall transfer level: Needs assistance Equipment used: Rolling walker (2 wheeled) Transfers: Sit to/from [facility] Sit to Stand: Min guard Stand pivot transfers: Min guard       General transfer comment: increased effort to stand on own with RW; increased time to take steps bed to recliner with RW  Ambulation/Gait             General Gait Details: Deferred (pt reports he does not ambulate at home d/t risk for falls)  Financial trader Rankin (Stroke Patients Only)       Balance Overall balance assessment: Needs assistance Sitting-balance support: Single extremity supported;Feet supported Sitting balance-Leahy Scale: Poor Sitting balance - Comments: pt requires at least single UE support for static sitting balance   Standing balance support: Bilateral upper extremity supported Standing balance-Leahy Scale: Poor Standing balance comment: pt requiring B UE support on RW for static standing balance                             Pertinent Vitals/Pain Pain Assessment: No/denies pain    Home Living Family/patient expects to  be discharged to:: Private residence Living Arrangements: Alone Available Help at Discharge: Family;Home health Type of Home: House Home Access: Stairs to enter Entrance Stairs-Rails: Left Entrance Stairs-Number of Steps: 3 Home Layout: One level Home Equipment: Leon - 2 wheels;Bedside commode;Grab bars - toilet;Grab bars - tub/shower;Shower seat;Other (comment);Wheelchair - manual(urinal)      Prior Function Level  of Independence: Needs assistance   Gait / Transfers Assistance Needed: Modified independent with bed mobility and transfers to/from w/c with RW; able to propel manual w/c modified independent when he has the energy.  Does not walk d/t concerns for falling.  ADL's / Homemaking Assistance Needed: [facility] comes 3x's/week (for 3 hours each time) to assist with bathing, dressing, cleaning, meals, and medications.  Comments: Pt's daughter comes almost every day to assist as needed.  Pt reports having call bell for [facility] when he needs extra assist.     Hand Dominance        Extremity/Trunk Assessment   Upper Extremity Assessment Upper Extremity Assessment: Generalized weakness    Lower Extremity Assessment Lower Extremity Assessment: Generalized weakness    Cervical / Trunk Assessment Cervical / Trunk Assessment: Kyphotic  Communication   Communication: No difficulties  Cognition Arousal/Alertness: Awake/alert Behavior During Therapy: WFL for tasks assessed/performed Overall Cognitive Status: (Oriented to person, place, time, and general situation.)                                 General Comments: Pt reporting not knowing how to call nursing upon PT arrival and also did not remember discussion of results with MD's today.      General Comments   Nursing cleared pt for participation in physical therapy.  Pt agreeable to PT session.    Exercises     Assessment/Plan    PT Assessment Patient needs continued PT services  PT Problem List Decreased strength;Decreased activity tolerance;Decreased balance;Decreased mobility;Cardiopulmonary status limiting activity       PT Treatment Interventions DME instruction;Functional mobility training;Therapeutic activities;Therapeutic exercise;Balance training;Patient/family education;Wheelchair mobility training    PT Goals (Current goals can be found in the Care Plan section)  Acute Rehab PT Goals Patient Stated Goal: to improve  mobility PT Goal Formulation: With patient Time For Goal Achievement: 03/17/18 Potential to Achieve Goals: Fair    Frequency Min 2X/week   Barriers to discharge        Co-evaluation               AM-PAC PT "6 Clicks" Daily Activity  Outcome Measure Difficulty turning over in bed (including adjusting bedclothes, sheets and blankets)?: A Lot Difficulty moving from lying on back to sitting on the side of the bed? : A Lot Difficulty sitting down on and standing up from a chair with arms (e.g., wheelchair, bedside commode, etc,.)?: Unable Help needed moving to and from a bed to chair (including a wheelchair)?: A Little Help needed walking in hospital room?: Total Help needed climbing 3-5 steps with a railing? : Total 6 Click Score: 10    End of Session Equipment Utilized During Treatment: Gait belt;Oxygen(2 L O2 via nasal cannula) Activity Tolerance: Patient limited by fatigue Patient left: in chair;with call bell/phone within reach;with nursing/sitter in room;Other (comment)(nurse and NT present end of session to assist pt with toileting (nurse aware of need to set chair alarm when done)) Nurse Communication: Mobility status;Precautions PT Visit Diagnosis: Other abnormalities of gait and mobility (R26.89);Muscle weakness (generalized) (M62.81);History of  falling (Z91.81)    Time: 1457-1530 PT Time Calculation (min) (ACUTE ONLY): 33 min   Charges:   PT Evaluation $PT Eval Low Complexity: 1 Low PT Treatments $Therapeutic Exercise: 8-22 mins       Leitha Bleak, PT 03/03/18, 4:06 PM [phone]

## 2018-03-03 NOTE — Procedures (Signed)
Indication:   RUL mass with post obstructive changes  Premedication:   Fentanyl 37.5 mcg Midaz 3 mg  Anesthesia: Topical to nose and throat 40 cc of 1% lidocaine used during the course of procedure  Procedure: After adequate sedation and anesthesia, the bronchoscope was introduced via the R naris and advanced into the posterior pharynx. Further anesthesia was obtained with 1% lidocaine and the scope was advanced into the trachea. Complete airway anesthesia was achieved with 1% lidocaine and a thorough airway examination was performed. 0.5 mg epinephrine was instilled into the posterior segment of the RUL prior to obtaining EBBx. This revealed the following findings:  Findings:  Upper airway - normal Tracheobronchial anatomy - normal Bronchial mucosa - mild chronic bronchitis changes Other - There appeared to be a somewhat distal obstructing lesion in the airway to the posterior segment of the RUL  Specimens:   BAL from RUL Cytology brushings from posterior segment of RUL Endobronchial biopsy from posterior segment of RUL  Complications: None. Minimal bleeding  Post procedure evaluation:  The patient tolerated the procedure well with no major complications CXR - not indicated   Merton Border, MD PCCM service Mobile [phone] Pager [phone]  03/03/2018 11:01 AM

## 2018-03-03 NOTE — Progress Notes (Signed)
Patient ID: Erik Carey, male   DOB: [birth year], 83 y.o.   MRN: [id number]  [facility] PROGRESS NOTE  Erik Carey WKG:[id number] DOB: [birth year] DOA: 03/01/2018 PCP: Raelyn Mora, MD  HPI/Subjective: Patient seen after bronchoscopy.  Having a little bit of wheeze.  States he feels better than when he came in.  Objective: Vitals:   03/03/18 1103 03/03/18 1240  BP: 99/64 132/70  Pulse: 92 84  Resp: 20 20  Temp:  (!) 97.4 F (36.3 C)  SpO2: 92% 96%    Filed Weights   03/01/18 1900 03/02/18 0226  Weight: 99.8 kg 94.2 kg    ROS: Review of Systems  Constitutional: Negative for chills and fever.  Eyes: Negative for blurred vision.  Respiratory: Positive for cough, shortness of breath and wheezing.   Cardiovascular: Negative for chest pain.  Gastrointestinal: Negative for abdominal pain, constipation, diarrhea, nausea and vomiting.  Genitourinary: Negative for dysuria.  Musculoskeletal: Negative for joint pain.  Neurological: Negative for dizziness and headaches.   Exam: Physical Exam  Constitutional: He is oriented to person, place, and time.  HENT:  Nose: No mucosal edema.  Mouth/Throat: No oropharyngeal exudate or posterior oropharyngeal edema.  Eyes: Pupils are equal, round, and reactive to light. Conjunctivae, EOM and lids are normal.  Neck: No JVD present. Carotid bruit is not present. No edema present. No thyroid mass and no thyromegaly present.  Cardiovascular: S1 normal and S2 normal. Exam reveals no gallop.  No murmur heard. Pulses:      Dorsalis pedis pulses are 2+ on the right side, and 2+ on the left side.  Respiratory: No respiratory distress. He has decreased breath sounds in the right middle field, the right lower field and the left lower field. He has no wheezes. He has no rhonchi. He has no rales.  GI: Soft. Bowel sounds are normal. There is no tenderness.  Musculoskeletal:       Right ankle: He exhibits no swelling.       Left ankle: He  exhibits no swelling.  Lymphadenopathy:    He has no cervical adenopathy.  Neurological: He is alert and oriented to person, place, and time. No cranial nerve deficit.  Skin: Skin is warm. No rash noted. Nails show no clubbing.  Psychiatric: He has a normal mood and affect.      Data Reviewed: Basic Metabolic Panel: Recent Labs  Lab 03/01/18 1820 03/02/18 0433 03/03/18 0430  NA 140 139 140  K 3.7 3.7 3.5  CL 105 108 110  CO2 21* 21* 23  GLUCOSE 139* 165* 160*  BUN 68* 60* 57*  CREATININE 1.75* 1.48* 1.30*  CALCIUM 8.2* 8.0* 8.2*  MG 2.1  --   --    CBC: Recent Labs  Lab 03/01/18 1820 03/02/18 0433 03/03/18 0430  WBC 37.1* 26.2* 21.2*  NEUTROABS 31.8*  --   --   HGB 12.0* 10.2* 9.2*  HCT 36.3* 31.1* 28.8*  MCV 92.8 93.4 96.0  PLT 392 323 273   Cardiac Enzymes: Recent Labs  Lab 03/01/18 1820  TROPONINI 0.03*   BNP (last 3 results) Recent Labs    03/01/18 1820  BNP 209.0*     Recent Results (from the past 240 hour(s))  Blood culture (routine x 2)     Status: None (Preliminary result)   Collection Time: 03/01/18  8:46 PM  Result Value Ref Range Status   Specimen Description BLOOD LEFT FOREARM  Final   Special Requests   Final  BOTTLES DRAWN AEROBIC AND ANAEROBIC Blood Culture adequate volume   Culture   Final    NO GROWTH 2 DAYS Performed at [facility], [address] [place], [place] [address]    Report Status PENDING  Incomplete  Blood culture (routine x 2)     Status: None (Preliminary result)   Collection Time: 03/01/18  8:46 PM  Result Value Ref Range Status   Specimen Description BLOOD RIGHT ANTECUBITAL  Final   Special Requests   Final    BOTTLES DRAWN AEROBIC AND ANAEROBIC Blood Culture results may not be optimal due to an excessive volume of blood received in culture bottles   Culture   Final    NO GROWTH 2 DAYS Performed at [facility] [address], [place], [place] [address]    Report Status PENDING   Incomplete  MRSA PCR Screening     Status: None   Collection Time: 03/02/18  8:35 AM  Result Value Ref Range Status   MRSA by PCR NEGATIVE NEGATIVE Final    Comment:        The GeneXpert MRSA Assay (FDA approved for NASAL specimens only), is one component of a comprehensive MRSA colonization surveillance program. It is not intended to diagnose MRSA infection nor to guide or monitor treatment for MRSA infections. Performed at [facility], [address] [place], [place] [address]   Body fluid culture     Status: None (Preliminary result)   Collection Time: 03/02/18  2:55 PM  Result Value Ref Range Status   Specimen Description   Final    PLEURAL Performed at [facility], [address], [place], [place] [address]    Special Requests   Final    NONE Performed at [facility], [place], [place] [address]    Gram Stain   Final    FEW WBC PRESENT, PREDOMINANTLY PMN RARE GRAM POSITIVE COCCI    Culture   Final    TOO YOUNG TO READ Performed at [facility], [place] [address], [place],  [address]    Report Status PENDING  Incomplete     Studies: Ct Angio Chest Pe W And/or Wo Contrast  Result Date: 03/01/2018 CLINICAL DATA:  Pt arrived via EMS from home. Pt reports SOB all day and it wouldn't get better. Reports recent hospitalization for COPD exacerbation. EXAM: CT ANGIOGRAPHY CHEST WITH CONTRAST TECHNIQUE: Multidetector CT imaging of the chest was performed using the standard protocol during bolus administration of intravenous contrast. Multiplanar CT image reconstructions and MIPs were obtained to evaluate the vascular anatomy. CONTRAST:  42mL OMNIPAQUE IOHEXOL 350 MG/ML SOLN COMPARISON:  Chest x-ray on 03/01/2018 FINDINGS: Cardiovascular: Heart size is normal. No pericardial effusion. Coronary artery calcifications are present. Thoracic aortic calcifications are present. No aneurysm or dissection. The pulmonary arteries  are well opacified and there is no evidence for acute pulmonary embolus. Mediastinum/Nodes: Esophagus is normal in appearance. No significant mediastinal adenopathy. The visualized portion of the thyroid gland has a normal appearance. Lungs/Pleura: There are emphysematous changes in lungs, primarily within the apices. There are multiple loculated fluid collections within the RIGHT pleural space. The largest of these is identified in the RIGHT LOWER lobe, measuring 6.2 x 10.4 centimeters. There is adjacent atelectatic enhancement associated with these collections. However no split pleural sign to indicate presence of empyema. Within the RIGHT hilar region there is a solid mass, measuring 6.3 x 7.0 x 4.5 centimeters. The adjacent bronchus appears occluded  and the findings are suspicious for malignancy. Upper Abdomen: Cholecystectomy. Atherosclerotic calcification of the abdominal aorta. Musculoskeletal: Midthoracic spondylosis. No suspicious lytic or blastic lesions are identified. Review of the MIP images confirms the above findings. IMPRESSION: 1. Technically adequate exam showing no acute pulmonary embolus. 2. RIGHT hilar mass measuring up to 7.0 centimeters suspicious for malignancy. There is occlusion of the adjacent bronchus, suspicious for endobronchial lesion. Recommend bronchoscopy. 3. Multiple loculated pleural fluid collections, suspicious for malignant effusion. No evidence for empyema. 4.  Emphysema (ICD10-J43.9). 5.  Aortic Atherosclerosis (ICD10-I70.0). Electronically Signed   By: Nolon Nations M.D.   On: 03/01/2018 20:21   Dg Chest Port 1 View  Result Date: 03/02/2018 CLINICAL DATA:  Status post RIGHT thoracentesis. EXAM: PORTABLE CHEST 1 VIEW COMPARISON:  03/01/2018 CT and chest radiograph FINDINGS: Cardiomediastinal silhouette is unchanged. RIGHT pleural effusion has decreased with areas of loculated RIGHT pleural fluid still present. There is no evidence of pneumothorax. Scattered areas of  RIGHT lung atelectasis are again noted. The LEFT lung is clear. IMPRESSION: Decreased RIGHT pleural effusion with areas of loculated RIGHT pleural fluid still present. No pneumothorax. Electronically Signed   By: Margarette Canada M.D.   On: 03/02/2018 15:19   Dg Chest Portable 1 View  Result Date: 03/01/2018 CLINICAL DATA:  Hx of COPD, tachy, extreme SOB, breathing treatment in progress, SMokerPt unable to give pertinent hx. EXAM: PORTABLE CHEST 1 VIEW COMPARISON:  None. FINDINGS: There is focal dense opacity in the right upper lung with hazy opacity throughout the remainder of the right lung superimposed on interstitial thickening. There is volume loss on the right relative hyperexpansion of the left lung. Cardiac silhouette is normal in size. No mediastinal or hilar masses. Left lung is clear. Possible small right pleural effusion. No evidence of a left pleural effusion. No pneumothorax. Skeletal structures are intact. IMPRESSION: 1. Abnormal findings in the right lung with upper lobe dense consolidation versus a possible mass, hazy opacity in the remaining right lung superimposed on interstitial thickening. Possible small right pleural effusion. Volume loss on the right. Recommend follow-up chest CT with contrast for further assessment. Electronically Signed   By: Lajean Manes M.D.   On: 03/01/2018 18:45   US Thoracentesis Asp Pleural Space W/img Guide  Result Date: 03/02/2018 INDICATION: Right pleural effusion EXAM: ULTRASOUND GUIDED RIGHT THORACENTESIS MEDICATIONS: None. COMPLICATIONS: None immediate. PROCEDURE: An ultrasound guided thoracentesis was thoroughly discussed with the patient and questions answered. The benefits, risks, alternatives and complications were also discussed. The patient understands and wishes to proceed with the procedure. Written consent was obtained. Ultrasound was performed to localize and mark an adequate pocket of fluid in the right chest. The area was then prepped and draped  in the normal sterile fashion. 1% Lidocaine was used for local anesthesia. Under ultrasound guidance a 6 Fr Safe-T-Centesis catheter was introduced. Thoracentesis was performed. The catheter was removed and a dressing applied. FINDINGS: A total of approximately 300 cc of clear yellow fluid was removed. Samples were sent to the laboratory as requested by the clinical team. IMPRESSION: Successful ultrasound guided right thoracentesis yielding 300 cc of pleural fluid. Electronically Signed   By: Marybelle Killings M.D.   On: 03/02/2018 14:54    Scheduled Meds: . allopurinol  400 mg Oral Daily  . diltiazem  180 mg Oral Daily  . docusate sodium  100 mg Oral BID  . fentaNYL      . midazolam      . pantoprazole  40 mg Oral  QHS  . tamsulosin  0.4 mg Oral Daily   Continuous Infusions: . sodium chloride Stopped (03/03/18 0330)  . ampicillin-sulbactam (UNASYN) IV 3 g (03/03/18 1542)    Assessment/Plan:  1. Acute hypoxic respiratory failure.  Oxygen supplementation and try to taper if able. 2. Pneumonia and leukocytosis.  Changed over to Unasyn as per Dr. Jamal Collin. 3. Lung mass, pneumonia with loculated effusions.  Thoracentesis consistent with empyema.  Appreciate cardiothoracic surgery consultation.  Not a great candidate for procedure at this time.  Status post bronchoscopy today.  Await pathology.  Await cytology from pleural fluid 4. COPD exacerbation.  Pulmonary stop steroids.  Continue nebulizer treatments. 5. Atrial fibrillation with rapid ventricular response on Cardizem orally.  Hold off on any anticoagulation at this time.  6. Acute kidney injury on chronic kidney disease stage III.  Continue to monitor closely. 7. Tobacco abuse. 8. Weakness.  Physical therapy evaluation.  Code Status:     Code Status Orders  (From admission, onward)         Start     Ordered   03/02/18 0227  Do not attempt resuscitation (DNR)  Continuous    Question Answer Comment  In the event of cardiac or  respiratory ARREST Do not call a "code blue"   In the event of cardiac or respiratory ARREST Do not perform Intubation, CPR, defibrillation or ACLS   In the event of cardiac or respiratory ARREST Use medication by any route, position, wound care, and other measures to relive pain and suffering. May use oxygen, suction and manual treatment of airway obstruction as needed for comfort.      03/02/18 0226        Code Status History    Date Active Date Inactive Code Status Order ID Comments User Context   12/15/2017 1419 12/18/2017 2059 DNR [id number]  Hillary Bow, MD ED    Advance Directive Documentation     Most Recent Value  Type of Advance Directive  Living will  Pre-existing out of facility DNR order (yellow form or pink MOST form)  -  "MOST" Form in Place?  -     Disposition Plan: To be determined  Consultants:  Pulmonary  Oncology  Procedures:  Thoracentesis  Bronchoscopy   Antibiotics:  Unasyn  Time spent: 28 minutes.  Spoke with daughter at the bedside  [employer]

## 2018-03-04 ENCOUNTER — Ambulatory Visit: Payer: Medicare Other | Admitting: Internal Medicine

## 2018-03-04 DIAGNOSIS — J9601 Acute respiratory failure with hypoxia: Secondary | ICD-10-CM

## 2018-03-04 DIAGNOSIS — Z9889 Other specified postprocedural states: Secondary | ICD-10-CM

## 2018-03-04 LAB — BASIC METABOLIC PANEL
ANION GAP: 10 (ref 5–15)
BUN: 48 mg/dL — AB (ref 8–23)
CALCIUM: 8 mg/dL — AB (ref 8.9–10.3)
CO2: 22 mmol/L (ref 22–32)
CREATININE: 1.25 mg/dL — AB (ref 0.61–1.24)
Chloride: 108 mmol/L (ref 98–111)
GFR calc Af Amer: >60 mL/min (ref >60)
GFR calc non Af Amer: 52 mL/min — ABNORMAL LOW (ref >60)
GLUCOSE: 103 mg/dL — AB (ref 70–99)
Potassium: 3.8 mmol/L (ref 3.5–5.1)
Sodium: 140 mmol/L (ref 135–145)

## 2018-03-04 LAB — FERRITIN: Ferritin: 948 ng/mL — ABNORMAL HIGH (ref 24–336)

## 2018-03-04 LAB — HEMOGLOBIN: Hemoglobin: 9.4 g/dL — ABNORMAL LOW (ref 13.0–17.0)

## 2018-03-04 NOTE — Progress Notes (Signed)
Hematology/Oncology Progress Note [facility] [phone] [phone]  Patient Care Team: Raelyn Mora, MD as PCP - General (Internal Medicine) Telford Nab, RN as Registered Nurse   Name of the patient: Erik Carey  [id number]  December 10, 1934  Date of visit: 03/04/18   INTERVAL HISTORY-  Status post thoracentesis on 03/01/2018, drained 300 cc pleural fluid.  LDH 59 7, WBC 19409,  03/02/2018 bronchoscopy showed possible obstructing tumor in posterior segment loop, difficult visualization.  While she was brushing biopsy obtained. Patient is breathing comfortably nasal cannula.  No family members at this time.  Still has mild cough.    Review of systems- ROS Constitutional: Negative for chills and fever.  Eyes: Negative for blurred vision.  Respiratory: Positive for cough, shortness of breath and wheezing.   Cardiovascular: Negative for chest pain.  Gastrointestinal: Negative for abdominal pain, constipation, diarrhea, nausea and vomiting.  Genitourinary: Negative for dysuria.  Musculoskeletal: Negative for joint pain.  Neurological: Negative for dizziness and headaches hematoma No Known Allergies  Patient Active Problem List   Diagnosis Date Noted  . Acute respiratory failure with hypoxia ([place])   . Atrial fibrillation with RVR ([place])   . Pleural effusion   . Lung mass 03/01/2018  . Pressure injury of skin 12/17/2017  . Rhabdomyolysis 12/15/2017     Past Medical History:  Diagnosis Date  . COPD (chronic obstructive pulmonary disease) ([place])   . CVA (cerebral vascular accident) ([place])   . Hypertension   . Left-sided weakness   . Slurred speech   . Tobacco use      Past Surgical History:  Procedure Laterality Date  . FLEXIBLE BRONCHOSCOPY N/A 03/03/2018   Procedure: FLEXIBLE BRONCHOSCOPY;  Surgeon: Wilhelmina Mcardle, MD;  Location: [facility] ORS;  Service: Pulmonary;  Laterality: N/A;    Social History   Socioeconomic  History  . Marital status: Single    Spouse name: Not on file  . Number of children: Not on file  . Years of education: Not on file  . Highest education level: Not on file  Occupational History  . Not on file  Social Needs  . Financial resource strain: Not very hard  . Food insecurity:    Worry: Patient refused    Inability: Patient refused  . Transportation needs:    Medical: Patient refused    Non-medical: Patient refused  Tobacco Use  . Smoking status: Former Smoker    Last attempt to quit: 11/29/2017    Years since quitting: 0.2  . Smokeless tobacco: Never Used  Substance and Sexual Activity  . Alcohol use: Not Currently  . Drug use: Not Currently  . Sexual activity: Not Currently  Lifestyle  . Physical activity:    Days per week: Patient refused    Minutes per session: Patient refused  . Stress: Only a little  Relationships  . Social connections:    Talks on phone: Patient refused    Gets together: Patient refused    Attends religious service: Patient refused    Active member of club or organization: Patient refused    Attends meetings of clubs or organizations: Patient refused    Relationship status: Patient refused  . Intimate partner violence:    Fear of current or ex partner: Patient refused    Emotionally abused: Patient refused    Physically abused: Patient refused    Forced sexual activity: Patient refused  Other Topics Concern  . Not on file  Social History Narrative  . Not  on file     Family History  Problem Relation Age of Onset  . Tuberculosis Mother      Current Facility-Administered Medications:  .  0.9 %  sodium chloride infusion, , Intravenous, PRN, Arta Silence, MD, Stopped at 03/03/18 0330 .  acetaminophen (TYLENOL) tablet 650 mg, 650 mg, Oral, Q6H PRN **OR** acetaminophen (TYLENOL) suppository 650 mg, 650 mg, Rectal, Q6H PRN, Amelia Jo, MD .  allopurinol (ZYLOPRIM) tablet 400 mg, 400 mg, Oral, Daily, Amelia Jo, MD, 300 mg  at 03/04/18 6226 .  Ampicillin-Sulbactam (UNASYN) 3 g in sodium chloride 0.9 % 100 mL IVPB, 3 g, Intravenous, Q6H, Oswald Hillock, RPH, Last Rate: 200 mL/hr at 03/04/18 1600, 3 g at 03/04/18 1600 .  bisacodyl (DULCOLAX) EC tablet 5 mg, 5 mg, Oral, Daily PRN, Amelia Jo, MD .  diltiazem (CARDIZEM CD) 24 hr capsule 180 mg, 180 mg, Oral, Daily, Amelia Jo, MD, 180 mg at 03/04/18 0846 .  docusate sodium (COLACE) capsule 100 mg, 100 mg, Oral, BID, Amelia Jo, MD, 100 mg at 03/04/18 2024 .  HYDROcodone-acetaminophen (NORCO/VICODIN) 5-325 MG per tablet 1-2 tablet, 1-2 tablet, Oral, Q4H PRN, Amelia Jo, MD, 1 tablet at 03/04/18 0846 .  ipratropium-albuterol (DUONEB) 0.5-2.5 (3) MG/3ML nebulizer solution 3 mL, 3 mL, Nebulization, Q4H PRN, Wilhelmina Mcardle, MD .  ondansetron (ZOFRAN) tablet 4 mg, 4 mg, Oral, Q6H PRN **OR** ondansetron (ZOFRAN) injection 4 mg, 4 mg, Intravenous, Q6H PRN, Amelia Jo, MD .  pantoprazole (PROTONIX) EC tablet 40 mg, 40 mg, Oral, QHS, Amelia Jo, MD, 40 mg at 03/04/18 2024 .  tamsulosin (FLOMAX) capsule 0.4 mg, 0.4 mg, Oral, Daily, Amelia Jo, MD, 0.4 mg at 03/04/18 0846 .  traZODone (DESYREL) tablet 25 mg, 25 mg, Oral, QHS PRN, Amelia Jo, MD, 25 mg at 03/02/18 2211   Physical exam:  Vitals:   03/03/18 1919 03/04/18 0340 03/04/18 0500 03/04/18 1921  BP: 124/80 138/73  117/66  Pulse: 83 91  97  Resp:    18  Temp: 97.7 F (36.5 C) (!) 97.4 F (36.3 C)  98.4 F (36.9 C)  TempSrc: Oral Oral  Oral  SpO2: 94% 96%  95%  Weight:   217 lb 6.4 oz (98.6 kg)   Height:       Physical Exam     CMP Latest Ref Rng & Units 03/04/2018  Glucose 70 - 99 mg/dL 103(H)  BUN 8 - 23 mg/dL 48(H)  Creatinine 0.61 - 1.24 mg/dL 1.25(H)  Sodium 135 - 145 mmol/L 140  Potassium 3.5 - 5.1 mmol/L 3.8  Chloride 98 - 111 mmol/L 108  CO2 22 - 32 mmol/L 22  Calcium 8.9 - 10.3 mg/dL 8.0(L)  Total Protein 6.5 - 8.1 g/dL -  Total Bilirubin 0.3 - 1.2 mg/dL -  Alkaline  Phos 38 - 126 U/L -  AST 15 - 41 U/L -  ALT 0 - 44 U/L -   CBC Latest Ref Rng & Units 03/04/2018  WBC 4.0 - 10.5 K/uL -  Hemoglobin 13.0 - 17.0 g/dL 9.4(L)  Hematocrit 39.0 - 52.0 % -  Platelets 150 - 400 K/uL -   RADIOGRAPHIC STUDIES: I have personally reviewed the radiological images as listed and agreed with the findings in the report. Ct Angio Chest Pe W And/or Wo Contrast  Result Date: 03/01/2018 CLINICAL DATA:  Pt arrived via EMS from home. Pt reports SOB all day and it wouldn't get better. Reports recent hospitalization for COPD exacerbation. EXAM: CT ANGIOGRAPHY CHEST WITH  CONTRAST TECHNIQUE: Multidetector CT imaging of the chest was performed using the standard protocol during bolus administration of intravenous contrast. Multiplanar CT image reconstructions and MIPs were obtained to evaluate the vascular anatomy. CONTRAST:  36m OMNIPAQUE IOHEXOL 350 MG/ML SOLN COMPARISON:  Chest x-ray on 03/01/2018 FINDINGS: Cardiovascular: Heart size is normal. No pericardial effusion. Coronary artery calcifications are present. Thoracic aortic calcifications are present. No aneurysm or dissection. The pulmonary arteries are well opacified and there is no evidence for acute pulmonary embolus. Mediastinum/Nodes: Esophagus is normal in appearance. No significant mediastinal adenopathy. The visualized portion of the thyroid gland has a normal appearance. Lungs/Pleura: There are emphysematous changes in lungs, primarily within the apices. There are multiple loculated fluid collections within the RIGHT pleural space. The largest of these is identified in the RIGHT LOWER lobe, measuring 6.2 x 10.4 centimeters. There is adjacent atelectatic enhancement associated with these collections. However no split pleural sign to indicate presence of empyema. Within the RIGHT hilar region there is a solid mass, measuring 6.3 x 7.0 x 4.5 centimeters. The adjacent bronchus appears occluded and the findings are suspicious for  malignancy. Upper Abdomen: Cholecystectomy. Atherosclerotic calcification of the abdominal aorta. Musculoskeletal: Midthoracic spondylosis. No suspicious lytic or blastic lesions are identified. Review of the MIP images confirms the above findings. IMPRESSION: 1. Technically adequate exam showing no acute pulmonary embolus. 2. RIGHT hilar mass measuring up to 7.0 centimeters suspicious for malignancy. There is occlusion of the adjacent bronchus, suspicious for endobronchial lesion. Recommend bronchoscopy. 3. Multiple loculated pleural fluid collections, suspicious for malignant effusion. No evidence for empyema. 4.  Emphysema (ICD10-J43.9). 5.  Aortic Atherosclerosis (ICD10-I70.0). Electronically Signed   By: ENolon NationsM.D.   On: 03/01/2018 20:21   Dg Chest Port 1 View  Result Date: 03/02/2018 CLINICAL DATA:  Status post RIGHT thoracentesis. EXAM: PORTABLE CHEST 1 VIEW COMPARISON:  03/01/2018 CT and chest radiograph FINDINGS: Cardiomediastinal silhouette is unchanged. RIGHT pleural effusion has decreased with areas of loculated RIGHT pleural fluid still present. There is no evidence of pneumothorax. Scattered areas of RIGHT lung atelectasis are again noted. The LEFT lung is clear. IMPRESSION: Decreased RIGHT pleural effusion with areas of loculated RIGHT pleural fluid still present. No pneumothorax. Electronically Signed   By: JMargarette CanadaM.D.   On: 03/02/2018 15:19   Dg Chest Portable 1 View  Result Date: 03/01/2018 CLINICAL DATA:  Hx of COPD, tachy, extreme SOB, breathing treatment in progress, SMokerPt unable to give pertinent hx. EXAM: PORTABLE CHEST 1 VIEW COMPARISON:  None. FINDINGS: There is focal dense opacity in the right upper lung with hazy opacity throughout the remainder of the right lung superimposed on interstitial thickening. There is volume loss on the right relative hyperexpansion of the left lung. Cardiac silhouette is normal in size. No mediastinal or hilar masses. Left lung is  clear. Possible small right pleural effusion. No evidence of a left pleural effusion. No pneumothorax. Skeletal structures are intact. IMPRESSION: 1. Abnormal findings in the right lung with upper lobe dense consolidation versus a possible mass, hazy opacity in the remaining right lung superimposed on interstitial thickening. Possible small right pleural effusion. Volume loss on the right. Recommend follow-up chest CT with contrast for further assessment. Electronically Signed   By: DLajean ManesM.D.   On: 03/01/2018 18:45   UKoreaThoracentesis Asp Pleural Space W/img Guide  Result Date: 03/02/2018 INDICATION: Right pleural effusion EXAM: ULTRASOUND GUIDED RIGHT THORACENTESIS MEDICATIONS: None. COMPLICATIONS: None immediate. PROCEDURE: An ultrasound guided thoracentesis was thoroughly discussed with  the patient and questions answered. The benefits, risks, alternatives and complications were also discussed. The patient understands and wishes to proceed with the procedure. Written consent was obtained. Ultrasound was performed to localize and mark an adequate pocket of fluid in the right chest. The area was then prepped and draped in the normal sterile fashion. 1% Lidocaine was used for local anesthesia. Under ultrasound guidance a 6 Fr Safe-T-Centesis catheter was introduced. Thoracentesis was performed. The catheter was removed and a dressing applied. FINDINGS: A total of approximately 300 cc of clear yellow fluid was removed. Samples were sent to the laboratory as requested by the clinical team. IMPRESSION: Successful ultrasound guided right thoracentesis yielding 300 cc of pleural fluid. Electronically Signed   By: Marybelle Killings M.D.   On: 03/02/2018 14:54    Assessment and plan-  Patient is a 83 y.o. male with history of CVA, left-sided weakness, history of tobacco use, COPD emphysema was sent via EMS for progressively worsening shortness of breath.  Image work-up including chest x-ray and CT chest showed  right hilar mass measuring up to 7 cm suspicious for malignancy.  There is also occlusion of adjacent bronchus suspicious for endobronchial lesion.  Multiple loculated pleural fluid  # #Right hilar mass with bronchus occlusion and multiple loculated pleural fluid S/p Thoracentesis. Exudate. Cytology pending.  S/p bronchoscopy, endobronchial obstruction, difficult to visualize. Brushing and washing sent for pathology.  Awaiting pathology.  # Leukocytosis, due to obstructive pneumonia. On antibiotics. Trending down.   Patient's daughter is considering transferring patient to [facility] hospital. Will follow up and discuss with patient and family once pathology comes back.  Thank you for allowing me to participate in the care of this patient.   Earlie Server, MD, PhD Hematology Oncology [facility] at [facility] Pager- [phone] 03/04/2018

## 2018-03-04 NOTE — Progress Notes (Signed)
We are still awaiting pathology results from the pleural fluid and bronchoscopy.  There has been no major change in clinical status.  I will follow-up after pathology results are available.  Merton Border, MD PCCM service Mobile [phone] Pager [phone] 03/04/2018 3:02 PM

## 2018-03-04 NOTE — Care Management Important Message (Signed)
Important Message  Patient Details  Name: Erik Carey MRN: [id number] Date of Birth: 06/29/1934   Medicare Important Message Given:  Yes    [name] 03/04/2018, 10:42 AM

## 2018-03-04 NOTE — Progress Notes (Signed)
Patient ID: Erik Carey, male   DOB: 30-Apr-1935, 83 y.o.   MRN: [id number]  Patient ID: Erik Carey, male   DOB: 10-22-1934, 83 y.o.   MRN: [id number]  [employer] PROGRESS NOTE  Erik Carey NWG:[id number] DOB: 1935/02/01 DOA: 03/01/2018 PCP: Erik Mora, MD  HPI/Subjective: Patient has some cough and shortness of breath but feels better than yesterday.  Eating well.  Objective: Vitals:   03/03/18 1919 03/04/18 0340  BP: 124/80 138/73  Pulse: 83 91  Resp:    Temp: 97.7 F (36.5 C) (!) 97.4 F (36.3 C)  SpO2: 94% 96%    Filed Weights   03/01/18 1900 03/02/18 0226 03/04/18 0500  Weight: 99.8 kg 94.2 kg 98.6 kg    ROS: Review of Systems  Constitutional: Negative for chills and fever.  Eyes: Negative for blurred vision.  Respiratory: Positive for cough and shortness of breath.   Cardiovascular: Negative for chest pain.  Gastrointestinal: Negative for abdominal pain, constipation, diarrhea, nausea and vomiting.  Genitourinary: Negative for dysuria.  Musculoskeletal: Negative for joint pain.  Neurological: Negative for dizziness and headaches.   Exam: Physical Exam  Constitutional: He is oriented to person, place, and time.  HENT:  Nose: No mucosal edema.  Mouth/Throat: No oropharyngeal exudate or posterior oropharyngeal edema.  Eyes: Pupils are equal, round, and reactive to light. Conjunctivae, EOM and lids are normal.  Neck: No JVD present. Carotid bruit is not present. No edema present. No thyroid mass and no thyromegaly present.  Cardiovascular: S1 normal and S2 normal. Exam reveals no gallop.  No murmur heard. Pulses:      Dorsalis pedis pulses are 2+ on the right side, and 2+ on the left side.  Respiratory: No respiratory distress. He has decreased breath sounds in the right lower field and the left lower field. He has no wheezes. He has no rhonchi. He has no rales.  GI: Soft. Bowel sounds are normal. There is no tenderness.  Musculoskeletal:        Right ankle: He exhibits no swelling.       Left ankle: He exhibits no swelling.  Lymphadenopathy:    He has no cervical adenopathy.  Neurological: He is alert and oriented to person, place, and time. No cranial nerve deficit.  Skin: Skin is warm. No rash noted. Nails show no clubbing.  Psychiatric: He has a normal mood and affect.      Data Reviewed: Basic Metabolic Panel: Recent Labs  Lab 03/01/18 1820 03/02/18 0433 03/03/18 0430 03/04/18 0550  NA 140 139 140 140  K 3.7 3.7 3.5 3.8  CL 105 108 110 108  CO2 21* 21* 23 22  GLUCOSE 139* 165* 160* 103*  BUN 68* 60* 57* 48*  CREATININE 1.75* 1.48* 1.30* 1.25*  CALCIUM 8.2* 8.0* 8.2* 8.0*  MG 2.1  --   --   --    CBC: Recent Labs  Lab 03/01/18 1820 03/02/18 0433 03/03/18 0430 03/04/18 0550  WBC 37.1* 26.2* 21.2*  --   NEUTROABS 31.8*  --   --   --   HGB 12.0* 10.2* 9.2* 9.4*  HCT 36.3* 31.1* 28.8*  --   MCV 92.8 93.4 96.0  --   PLT 392 323 273  --    Cardiac Enzymes: Recent Labs  Lab 03/01/18 1820  TROPONINI 0.03*   BNP (last 3 results) Recent Labs    03/01/18 1820  BNP 209.0*     Recent Results (from the past 240 hour(s))  Blood culture (routine x 2)     Status: None (Preliminary result)   Collection Time: 03/01/18  8:46 PM  Result Value Ref Range Status   Specimen Description BLOOD LEFT FOREARM  Final   Special Requests   Final    BOTTLES DRAWN AEROBIC AND ANAEROBIC Blood Culture adequate volume   Culture   Final    NO GROWTH 3 DAYS Performed at [facility], [address], [place], [place] [address]    Report Status PENDING  Incomplete  Blood culture (routine x 2)     Status: None (Preliminary result)   Collection Time: 03/01/18  8:46 PM  Result Value Ref Range Status   Specimen Description BLOOD RIGHT ANTECUBITAL  Final   Special Requests   Final    BOTTLES DRAWN AEROBIC AND ANAEROBIC Blood Culture results may not be optimal due to an excessive volume of blood received in culture  bottles   Culture   Final    NO GROWTH 3 DAYS Performed at [facility], [address] [place], [place] [address]    Report Status PENDING  Incomplete  MRSA PCR Screening     Status: None   Collection Time: 03/02/18  8:35 AM  Result Value Ref Range Status   MRSA by PCR NEGATIVE NEGATIVE Final    Comment:        The GeneXpert MRSA Assay (FDA approved for NASAL specimens only), is one component of a comprehensive MRSA colonization surveillance program. It is not intended to diagnose MRSA infection nor to guide or monitor treatment for MRSA infections. Performed at [facility], [address] [place], [place] [address]   Body fluid culture     Status: None (Preliminary result)   Collection Time: 03/02/18  2:55 PM  Result Value Ref Range Status   Specimen Description   Final    PLEURAL Performed at [facility], [address] [place], [place] [address]    Special Requests   Final    NONE Performed at [facility], [place], [place] [address]    Gram Stain   Final    FEW WBC PRESENT, PREDOMINANTLY PMN RARE GRAM POSITIVE COCCI    Culture   Final    TOO YOUNG TO READ Performed at [facility], [place] [address], [place], [place] [address]    Report Status PENDING  Incomplete     Studies: Dg Chest Port 1 View  Result Date: 03/02/2018 CLINICAL DATA:  Status post RIGHT thoracentesis. EXAM: PORTABLE CHEST 1 VIEW COMPARISON:  03/01/2018 CT and chest radiograph FINDINGS: Cardiomediastinal silhouette is unchanged. RIGHT pleural effusion has decreased with areas of loculated RIGHT pleural fluid still present. There is no evidence of pneumothorax. Scattered areas of RIGHT lung atelectasis are again noted. The LEFT lung is clear. IMPRESSION: Decreased RIGHT pleural effusion with areas of loculated RIGHT pleural fluid still present. No pneumothorax. Electronically Signed   By: Margarette Canada M.D.   On: 03/02/2018 15:19   US  Thoracentesis Asp Pleural Space W/img Guide  Result Date: 03/02/2018 INDICATION: Right pleural effusion EXAM: ULTRASOUND GUIDED RIGHT THORACENTESIS MEDICATIONS: None. COMPLICATIONS: None immediate. PROCEDURE: An ultrasound guided thoracentesis was thoroughly discussed with the patient and questions answered. The benefits, risks, alternatives and complications were also discussed. The patient understands and wishes to proceed with the procedure. Written consent was obtained. Ultrasound was performed to localize and mark an adequate pocket of fluid in the right chest. The area was then  prepped and draped in the normal sterile fashion. 1% Lidocaine was used for local anesthesia. Under ultrasound guidance a 6 Fr Safe-T-Centesis catheter was introduced. Thoracentesis was performed. The catheter was removed and a dressing applied. FINDINGS: A total of approximately 300 cc of clear yellow fluid was removed. Samples were sent to the laboratory as requested by the clinical team. IMPRESSION: Successful ultrasound guided right thoracentesis yielding 300 cc of pleural fluid. Electronically Signed   By: Marybelle Killings M.D.   On: 03/02/2018 14:54    Scheduled Meds: . allopurinol  400 mg Oral Daily  . diltiazem  180 mg Oral Daily  . docusate sodium  100 mg Oral BID  . pantoprazole  40 mg Oral QHS  . tamsulosin  0.4 mg Oral Daily   Continuous Infusions: . sodium chloride Stopped (03/03/18 0330)  . ampicillin-sulbactam (UNASYN) IV 3 g (03/04/18 0844)    Assessment/Plan:  1. Acute hypoxic respiratory failure.  Oxygen supplementation.  Taper when able. 2. Pneumonia and leukocytosis.  Changed over to Unasyn as per Dr. Alva Garnet. 3. Lung mass, pneumonia with loculated effusions.  Thoracentesis consistent with empyema.  Appreciate cardiothoracic surgery consultation.  Not a great candidate for procedure at this time.  Status post bronchoscopy yesterday.  Await pathology.  Await cytology from pleural fluid.  Daughter  would like to wait for pathology prior to making further decisions [facility]) 4. COPD exacerbation.  Continue nebulizer treatments. 5. Atrial fibrillation with rapid ventricular response on Cardizem orally.  Hold off on any anticoagulation at this time.  6. Acute kidney injury on chronic kidney disease stage III.  Continue to monitor closely. 7. Tobacco abuse. 8. Weakness.  Physical therapy evaluation.  Code Status:     Code Status Orders  (From admission, onward)         Start     Ordered   03/02/18 0227  Do not attempt resuscitation (DNR)  Continuous    Question Answer Comment  In the event of cardiac or respiratory ARREST Do not call a "code blue"   In the event of cardiac or respiratory ARREST Do not perform Intubation, CPR, defibrillation or ACLS   In the event of cardiac or respiratory ARREST Use medication by any route, position, wound care, and other measures to relive pain and suffering. May use oxygen, suction and manual treatment of airway obstruction as needed for comfort.      03/02/18 0226        Code Status History    Date Active Date Inactive Code Status Order ID Comments User Context   12/15/2017 1419 12/18/2017 2059 DNR [id number]  Hillary Bow, MD ED    Advance Directive Documentation     Most Recent Value  Type of Advance Directive  Living will  Pre-existing out of facility DNR order (yellow form or pink MOST form)  -  "MOST" Form in Place?  -     Disposition Plan: To be determined  Consultants:  Pulmonary  Oncology  Cardiothoracic surgery  Procedures:  Thoracentesis  Bronchoscopy   Antibiotics:  Unasyn  Time spent: 28 minutes.  Spoke with daughter on the phone  [employer]

## 2018-03-04 NOTE — Care Management (Signed)
Dr. Leslye Peer discussed transferring to [facility] with daughter Kiernan Atkerson. Daughter wants to wait for pathology results to come back before making this decision. Shelbie Ammons RN MSN CCM Care Management [phone]

## 2018-03-05 LAB — CYTOLOGY - NON PAP

## 2018-03-05 LAB — SURGICAL PATHOLOGY

## 2018-03-05 LAB — GLUCOSE, CAPILLARY: Glucose-Capillary: 95 mg/dL (ref 70–99)

## 2018-03-05 LAB — OCCULT BLOOD X 1 CARD TO LAB, STOOL: Fecal Occult Bld: NEGATIVE

## 2018-03-05 NOTE — Progress Notes (Signed)
PT Cancellation Note  Patient Details Name: Erik Carey MRN: [id number] DOB: 03-Jan-1935   Cancelled Treatment:    Reason Eval/Treat Not Completed: Fatigue/lethargy limiting ability to participate   Pt offered and encouraged to participate in session this am.  Pt reported general fatigue and SOB.  O2 checked at 95% HR 91. Pt stated he did not feel up to therapy today.  Will continue as appropriate.   Chesley Noon 03/05/2018, 11:40 AM

## 2018-03-05 NOTE — Progress Notes (Signed)
PULMONARY PROGRESS NOTE  Requesting MD/Service: Leslye Peer Date of initial consultation: 03/02/18 Reason for consultation: Multiloculated R pleural effusion, possible R lung mass  PT PROFILE: 83 y.o. male former smoker adm to [facility] 03/01/2018 with several days of cough and increasing SOB.  Hospitalized August 2019 with weakness after an apparent fall.  No CXR was performed during that hospitalization  DATA: 03/01/18 CTA chest: No pulmonary embolism.  Findings of moderate emphysema, predominantly in upper lobes.  Multiple loculated pleural fluid collections on the R.  Official report indicates 7 cm right perihilar mass.  My interpretation: Airway obstruction of posterior segment of RUL with possible small mass and postobstructive pneumonia/atelectasis. 03/01/18 Thoracentesis: 300 cc clear yellow fluid removed. LDH 5970. WBC 19,409. 79% neutrophils. Few GPC on Gram stain 03/02/18 Bronchoscopy: possible obstructing tumor in posterior segment of RUL - difficult visualization. Washings, brushings, EBBx obtained  INTERVAL: Pleural fluid culture positive for Streptococcus intermedius  SUBJ: More fatigued this morning.  No change shortness of breath.  No pleuritic chest pain.  No fevers in last 24 hours  OBJ: Vitals:   03/04/18 0500 03/04/18 1921 03/05/18 0348 03/05/18 1337  BP:  117/66 106/66 (!) 147/71  Pulse:  97 90 96  Resp:  18 17 20   Temp:  98.4 F (36.9 C) 98 F (36.7 C) 98 F (36.7 C)  TempSrc:  Oral Oral Oral  SpO2:  95% 92% 91%  Weight: 98.6 kg  99.5 kg   Height:         EXAM:  Gen: NAD HEENT: WNL Lungs: Accentuated breath sounds in RUL, no wheezes Cardiovascular: Regular, no M Abdomen: Soft, NT, NABS Ext: No C/C/E Neuro: No new findings  DATA:   BMP Latest Ref Rng & Units 03/04/2018 03/03/2018 03/02/2018  Glucose 70 - 99 mg/dL 103(H) 160(H) 165(H)  BUN 8 - 23 mg/dL 48(H) 57(H) 60(H)  Creatinine 0.61 - 1.24 mg/dL 1.25(H) 1.30(H) 1.48(H)  Sodium 135 - 145 mmol/L 140  140 139  Potassium 3.5 - 5.1 mmol/L 3.8 3.5 3.7  Chloride 98 - 111 mmol/L 108 110 108  CO2 22 - 32 mmol/L 22 23 21(L)  Calcium 8.9 - 10.3 mg/dL 8.0(L) 8.2(L) 8.0(L)    CBC Latest Ref Rng & Units 03/04/2018 03/03/2018 03/02/2018  WBC 4.0 - 10.5 K/uL - 21.2(H) 26.2(H)  Hemoglobin 13.0 - 17.0 g/dL 9.4(L) 9.2(L) 10.2(L)  Hematocrit 39.0 - 52.0 % - 28.8(L) 31.1(L)  Platelets 150 - 400 K/uL - 273 323    CXR: No new film   I have personally reviewed all chest radiographs reported above including CXRs and CT chest unless otherwise indicated  IMPRESSION:   1) COPD with findings of moderate emphysema on CT scan 2) acute hypoxemic respiratory failure 3) probable lung tumor with obstruction of posterior segment of RUL 4) probable postobstructive pneumonia 5) complicated parapneumonic effusion  Although it was not clearly visualized, based on the CT scan and bronchoscopy findings I do believe he has endobronchial obstruction involving the airway to the posterior segment of the RUL.  I think most of the other findings in the RUL represent postobstructive pneumonia.  Pleural fluid analysis is most consistent with a complicated parapneumonic effusion although it is possible that it is also a malignant effusion.  Successful treatment of the complicated pleural effusion is not likely possible without more complete drainage of the pleural space  PLAN:  Cont Unasyn.  If no surgical drainage of the pleural spaces planned, I would plan on a 4-week course of antibiotics.  After discharge from the hospital, Unasyn can be transitioned to Augmentin.  If he does undergo surgical drainage of the pleural space, I suspect a total of 2 weeks of antibiotics should suffice.  Either way, he will need repeat imaging of his chest and I would recommend a CT chest in 4 to 6 weeks.  With regard to the possible lung tumor with airway obstruction, per verbal report to me it does not appear that the bronchoscopic biopsies  are going to be diagnostic.  If so, I would focus on treatment of the infection with a plan of reimaging his chest in 4 to 6 weeks  His daughter informs me that they are considering transfer to [facility].  If he is transferred, he should take a copy of his CT scan on a CD for the pulmonologist there to review.  I have provided her with my personal number and offered that they may call me at any time to discuss my bronchoscopy findings.   Merton Border, MD PCCM service Mobile [phone] Pager [phone] 03/05/2018 1:39 PM

## 2018-03-05 NOTE — Progress Notes (Signed)
Erik Carey, male   DOB: 1934/06/25, 83 y.o.   MRN: [id number]  Erik ID: Erik Carey, male   DOB: 1934/05/12, 83 y.o.   MRN: [id number]  [facility] PROGRESS NOTE  Erik Carey NWG:[id number] DOB: Apr 16, 1935 DOA: 03/01/2018 PCP: Raelyn Mora, MD  HPI/Subjective:  Has right chest and shoulder pain. SOB and cough present. On 2 L O2  Weak. Uses walker/cane/wheel chair normally  Objective: Vitals:   03/04/18 1921 03/05/18 0348  BP: 117/66 106/66  Pulse: 97 90  Resp: 18 17  Temp: 98.4 F (36.9 C) 98 F (36.7 C)  SpO2: 95% 92%    Filed Weights   03/02/18 0226 03/04/18 0500 03/05/18 0348  Weight: 94.2 kg 98.6 kg 99.5 kg    ROS: Review of Systems  Constitutional: Negative for chills and fever.  Eyes: Negative for blurred vision.  Respiratory: Positive for cough and shortness of breath.   Cardiovascular: Negative for chest pain.  Gastrointestinal: Negative for abdominal pain, constipation, diarrhea, nausea and vomiting.  Genitourinary: Negative for dysuria.  Musculoskeletal: Negative for joint pain.  Neurological: Negative for dizziness and headaches.   Exam: Physical Exam  Constitutional: He is oriented to person, place, and time.  HENT:  Nose: No mucosal edema.  Mouth/Throat: No oropharyngeal exudate or posterior oropharyngeal edema.  Eyes: Pupils are equal, round, and reactive to light. Conjunctivae, EOM and lids are normal.  Neck: No JVD present. Carotid bruit is not present. No edema present. No thyroid mass and no thyromegaly present.  Cardiovascular: S1 normal and S2 normal. Exam reveals no gallop.  No murmur heard. Pulses:      Dorsalis pedis pulses are 2+ on the right side, and 2+ on the left side.  Respiratory: No respiratory distress. He has decreased breath sounds in the right lower field and the left lower field. He has no wheezes. He has no rhonchi. He has no rales.  GI: Soft. Bowel sounds are normal. There is no tenderness.   Musculoskeletal:       Right ankle: He exhibits no swelling.       Left ankle: He exhibits no swelling.  Lymphadenopathy:    He has no cervical adenopathy.  Neurological: He is alert and oriented to person, place, and time. No cranial nerve deficit.  Skin: Skin is warm. No rash noted. Nails show no clubbing.  Psychiatric: He has a normal mood and affect.   Data Reviewed: Basic Metabolic Panel: Recent Labs  Lab 03/01/18 1820 03/02/18 0433 03/03/18 0430 03/04/18 0550  NA 140 139 140 140  K 3.7 3.7 3.5 3.8  CL 105 108 110 108  CO2 21* 21* 23 22  GLUCOSE 139* 165* 160* 103*  BUN 68* 60* 57* 48*  CREATININE 1.75* 1.48* 1.30* 1.25*  CALCIUM 8.2* 8.0* 8.2* 8.0*  MG 2.1  --   --   --    CBC: Recent Labs  Lab 03/01/18 1820 03/02/18 0433 03/03/18 0430 03/04/18 0550  WBC 37.1* 26.2* 21.2*  --   NEUTROABS 31.8*  --   --   --   HGB 12.0* 10.2* 9.2* 9.4*  HCT 36.3* 31.1* 28.8*  --   MCV 92.8 93.4 96.0  --   PLT 392 323 273  --    Cardiac Enzymes: Recent Labs  Lab 03/01/18 1820  TROPONINI 0.03*   BNP (last 3 results) Recent Labs    03/01/18 1820  BNP 209.0*     Recent Results (from the past 240 hour(s))  Blood culture (routine x 2)     Status: None (Preliminary result)   Collection Time: 03/01/18  8:46 PM  Result Value Ref Range Status   Specimen Description BLOOD LEFT FOREARM  Final   Special Requests   Final    BOTTLES DRAWN AEROBIC AND ANAEROBIC Blood Culture adequate volume   Culture   Final    NO GROWTH 4 DAYS Performed at [facility], [address] [place], [place] [address]    Report Status PENDING  Incomplete  Blood culture (routine x 2)     Status: None (Preliminary result)   Collection Time: 03/01/18  8:46 PM  Result Value Ref Range Status   Specimen Description BLOOD RIGHT ANTECUBITAL  Final   Special Requests   Final    BOTTLES DRAWN AEROBIC AND ANAEROBIC Blood Culture results may not be optimal due to an excessive volume of blood  received in culture bottles   Culture   Final    NO GROWTH 4 DAYS Performed at [facility], [address], [place], [place] [address]    Report Status PENDING  Incomplete  MRSA PCR Screening     Status: None   Collection Time: 03/02/18  8:35 AM  Result Value Ref Range Status   MRSA by PCR NEGATIVE NEGATIVE Final    Comment:        The GeneXpert MRSA Assay (FDA approved for NASAL specimens only), is one component of a comprehensive MRSA colonization surveillance program. It is not intended to diagnose MRSA infection nor to guide or monitor treatment for MRSA infections. Performed at [facility], [address] [place], [place] [address]   Body fluid culture     Status: None (Preliminary result)   Collection Time: 03/02/18  2:55 PM  Result Value Ref Range Status   Specimen Description   Final    PLEURAL Performed at [facility], [address], [place], [place] [address]    Special Requests   Final    NONE Performed at [facility], [address] [place], [place] [address]    Gram Stain   Final    FEW WBC PRESENT, PREDOMINANTLY PMN RARE GRAM POSITIVE COCCI    Culture   Final    MODERATE STREPTOCOCCUS INTERMEDIUS SUSCEPTIBILITIES TO FOLLOW Performed at [facility], [place] [address] [place], [place] [address]    Report Status PENDING  Incomplete     Studies: No results found.  Scheduled Meds: . allopurinol  400 mg Oral Daily  . diltiazem  180 mg Oral Daily  . docusate sodium  100 mg Oral BID  . pantoprazole  40 mg Oral QHS  . tamsulosin  0.4 mg Oral Daily   Continuous Infusions: . sodium chloride Stopped (03/03/18 0330)  . ampicillin-sulbactam (UNASYN) IV 3 g (03/05/18 0413)    Assessment/Plan:  1. Acute hypoxic respiratory failure.  Oxygen supplementation.  Taper when able. 2. Right post obstructive Pneumonia and leukocytosis.  ON unasyn 3. Right Lung mass, post obstructive pneumonia with loculated  effusions.  Thoracentesis consistent with empyema.  Appreciate cardiothoracic surgery consultation.  Not a great candidate for procedure at this time.  Status post bronchoscopy .  Await pathology results.  Await cytology from pleural fluid.  Daughter would like to wait for pathology prior to making further decisions (about [facility] versus [facility]). Streptococcus in pleural fluid. Waiting for final cx and sensitivities. 4. COPD exacerbation.  Continue nebulizer treatments. 5. Atrial fibrillation with rapid ventricular  response on Cardizem orally.  Hold off on any anticoagulation at this time due to needing further procedures. 6. Acute kidney injury on chronic kidney disease stage III.  Continue to monitor closely. 7. Tobacco abuse. 8. Weakness.  Physical therapy evaluation.  Code Status:     Code Status Orders  (From admission, onward)         Start     Ordered   03/02/18 0227  Do not attempt resuscitation (DNR)  Continuous    Question Answer Comment  In the event of cardiac or respiratory ARREST Do not call a "code blue"   In the event of cardiac or respiratory ARREST Do not perform Intubation, CPR, defibrillation or ACLS   In the event of cardiac or respiratory ARREST Use medication by any route, position, wound care, and other measures to relive pain and suffering. May use oxygen, suction and manual treatment of airway obstruction as needed for comfort.      03/02/18 0226        Code Status History    Date Active Date Inactive Code Status Order ID Comments User Context   12/15/2017 1419 12/18/2017 2059 DNR [id number]  Hillary Bow, MD ED    Advance Directive Documentation     Most Recent Value  Type of Advance Directive  Living will  Pre-existing out of facility DNR order (yellow form or pink MOST form)  -  "MOST" Form in Place?  -     Disposition Plan: To be determined  Consultants:  Pulmonary  Oncology  Cardiothoracic  surgery  Procedures:  Thoracentesis  Bronchoscopy   Antibiotics:  Unasyn  Time spent: 30 minutes.  Spoke with daughter on the phone  [facility]

## 2018-03-05 NOTE — Plan of Care (Signed)
  Problem: Education: Goal: Knowledge of General Education information will improve Description Including pain rating scale, medication(s)/side effects and non-pharmacologic comfort measures Outcome: Progressing   Problem: Health Behavior/Discharge Planning: Goal: Ability to manage health-related needs will improve Outcome: Progressing   Problem: Clinical Measurements: Goal: Ability to maintain clinical measurements within normal limits will improve Outcome: Progressing Goal: Will remain free from infection Outcome: Progressing Goal: Diagnostic test results will improve Outcome: Progressing Goal: Respiratory complications will improve Outcome: Progressing Goal: Cardiovascular complication will be avoided Outcome: Progressing   Problem: Activity: Goal: Risk for activity intolerance will decrease Outcome: Progressing   Problem: Safety: Goal: Ability to remain free from injury will improve Outcome: Progressing   Problem: Skin Integrity: Goal: Risk for impaired skin integrity will decrease Outcome: Progressing   

## 2018-03-06 ENCOUNTER — Inpatient Hospital Stay: Payer: Medicare Other

## 2018-03-06 DIAGNOSIS — J154 Pneumonia due to other streptococci: Secondary | ICD-10-CM

## 2018-03-06 DIAGNOSIS — D72829 Elevated white blood cell count, unspecified: Secondary | ICD-10-CM

## 2018-03-06 DIAGNOSIS — J439 Emphysema, unspecified: Secondary | ICD-10-CM

## 2018-03-06 DIAGNOSIS — F17201 Nicotine dependence, unspecified, in remission: Secondary | ICD-10-CM

## 2018-03-06 LAB — CBC WITH DIFFERENTIAL/PLATELET
Abs Immature Granulocytes: 0.51 10*3/uL — ABNORMAL HIGH (ref 0.00–0.07)
BASOS ABS: 0.1 10*3/uL (ref 0.0–0.1)
BASOS PCT: 0 %
EOS PCT: 0 %
Eosinophils Absolute: 0 10*3/uL (ref 0.0–0.5)
HCT: 29.9 % — ABNORMAL LOW (ref 39.0–52.0)
Hemoglobin: 9.8 g/dL — ABNORMAL LOW (ref 13.0–17.0)
IMMATURE GRANULOCYTES: 3 %
Lymphocytes Relative: 10 %
Lymphs Abs: 1.7 10*3/uL (ref 0.7–4.0)
MCH: 30.8 pg (ref 26.0–34.0)
MCHC: 32.8 g/dL (ref 30.0–36.0)
MCV: 94 fL (ref 80.0–100.0)
Monocytes Absolute: 0.6 10*3/uL (ref 0.1–1.0)
Monocytes Relative: 4 %
NEUTROS PCT: 83 %
NRBC: 0.4 % — AB (ref 0.0–0.2)
Neutro Abs: 13.1 10*3/uL — ABNORMAL HIGH (ref 1.7–7.7)
Platelets: 216 10*3/uL (ref 150–400)
RBC: 3.18 MIL/uL — ABNORMAL LOW (ref 4.22–5.81)
RDW: 14.7 % (ref 11.5–15.5)
WBC: 15.9 10*3/uL — AB (ref 4.0–10.5)

## 2018-03-06 LAB — CULTURE, BLOOD (ROUTINE X 2)
CULTURE: NO GROWTH
Culture: NO GROWTH
SPECIAL REQUESTS: ADEQUATE

## 2018-03-06 LAB — BODY FLUID CULTURE

## 2018-03-06 LAB — GLUCOSE, CAPILLARY: Glucose-Capillary: 94 mg/dL (ref 70–99)

## 2018-03-06 LAB — BASIC METABOLIC PANEL
Anion gap: 6 (ref 5–15)
BUN: 27 mg/dL — ABNORMAL HIGH (ref 8–23)
CALCIUM: 7.7 mg/dL — AB (ref 8.9–10.3)
CO2: 26 mmol/L (ref 22–32)
Chloride: 109 mmol/L (ref 98–111)
Creatinine, Ser: 0.97 mg/dL (ref 0.61–1.24)
GFR calc non Af Amer: >60 mL/min (ref >60)
Glucose, Bld: 103 mg/dL — ABNORMAL HIGH (ref 70–99)
Potassium: 3.1 mmol/L — ABNORMAL LOW (ref 3.5–5.1)
SODIUM: 141 mmol/L (ref 135–145)

## 2018-03-06 MED ORDER — ENOXAPARIN SODIUM 40 MG/0.4ML ~~LOC~~ SOLN
40.0000 mg | SUBCUTANEOUS | Status: DC
  Administered 2018-03-06 – 2018-03-08 (×3): 40 mg via SUBCUTANEOUS
  Filled 2018-03-06 (×3): qty 0.4

## 2018-03-06 MED ORDER — POTASSIUM CHLORIDE CRYS ER 20 MEQ PO TBCR
40.0000 meq | EXTENDED_RELEASE_TABLET | Freq: Once | ORAL | Status: AC
  Administered 2018-03-06: 40 meq via ORAL
  Filled 2018-03-06: qty 2

## 2018-03-06 NOTE — Care Management Important Message (Signed)
Important Message  Patient Details  Name: Erik Carey MRN: [id number] Date of Birth: 1934-08-14   Medicare Important Message Given:  Yes    Juliann Pulse A Rawlins Stuard 03/06/2018, 11:33 AM

## 2018-03-06 NOTE — Plan of Care (Signed)
  Problem: Education: Goal: Knowledge of General Education information will improve Description Including pain rating scale, medication(s)/side effects and non-pharmacologic comfort measures Outcome: Progressing   Problem: Health Behavior/Discharge Planning: Goal: Ability to manage health-related needs will improve Outcome: Progressing   Problem: Clinical Measurements: Goal: Ability to maintain clinical measurements within normal limits will improve Outcome: Progressing Goal: Will remain free from infection Outcome: Progressing Goal: Diagnostic test results will improve Outcome: Progressing Goal: Respiratory complications will improve Outcome: Progressing Goal: Cardiovascular complication will be avoided Outcome: Progressing   Problem: Activity: Goal: Risk for activity intolerance will decrease Outcome: Progressing   Problem: Safety: Goal: Ability to remain free from injury will improve Outcome: Progressing   Problem: Skin Integrity: Goal: Risk for impaired skin integrity will decrease Outcome: Progressing   

## 2018-03-06 NOTE — Care Management (Signed)
Patient requesting transfer to the [place] at this time. Spoke at length with patient, daughter and MD and all parties are in agreement to pursue transfer. [facility] and they have received all necessary information for admission processing. All necessary documentation was faxed to the [place] and they have confirmed they have necessary paperwork. Admissions has notified me there are three referrals are ahead of this patient and the transfer process could occur over the weekend or potentially Monday. This RNCM will be covering this weekend and will plan on speaking with the [place] for updates.

## 2018-03-06 NOTE — Progress Notes (Signed)
PULMONARY PROGRESS NOTE  Requesting MD/Service: Leslye Peer Date of initial consultation: 03/02/18 Reason for consultation: Multiloculated R pleural effusion, possible R lung mass  PT PROFILE: 83 y.o. male former smoker adm to [facility] 03/01/2018 with several days of cough and increasing SOB.  Hospitalized August 2019 with weakness after an apparent fall.  No CXR was performed during that hospitalization  DATA: 03/01/18 CTA chest: No pulmonary embolism.  Findings of moderate emphysema, predominantly in upper lobes.  Multiple loculated pleural fluid collections on the R.  Official report indicates 7 cm right perihilar mass.  My interpretation: Airway obstruction of posterior segment of RUL with possible small mass and postobstructive pneumonia/atelectasis. 03/01/18 Thoracentesis: 300 cc clear yellow fluid removed. LDH 5970. WBC 19,409. 79% neutrophils. Few GPC on Gram stain 03/02/18 Bronchoscopy: possible obstructing tumor in posterior segment of RUL - difficult visualization. Washings, brushings, EBBx obtained  INTERVAL: Pleural fluid culture positive for Streptococcus intermedius  SUBJ: More fatigued this morning.  No change shortness of breath.  No pleuritic chest pain.  No fevers in last 24 hours  OBJ: Vitals:   03/05/18 1337 03/05/18 1934 03/06/18 0455 03/06/18 0923  BP: (!) 147/71 131/74 (!) 142/80 125/67  Pulse: 96 88 91 96  Resp: 20 17 18 20   Temp: 98 F (36.7 C) 99 F (37.2 C) 98 F (36.7 C) 97.8 F (36.6 C)  TempSrc: Oral Oral Oral Oral  SpO2: 91% 96% 95% 95%  Weight:   98.6 kg   Height:        EXAM:  Gen: NAD HEENT: WNL Lungs: Accentuated breath sounds in RUL, no wheezes Cardiovascular: Regular, no M Abdomen: Soft, NT, NABS Ext: No C/C/E Neuro: No new findings  DATA:   BMP Latest Ref Rng & Units 03/06/2018 03/04/2018 03/03/2018  Glucose 70 - 99 mg/dL 103(H) 103(H) 160(H)  BUN 8 - 23 mg/dL 27(H) 48(H) 57(H)  Creatinine 0.61 - 1.24 mg/dL 0.97 1.25(H) 1.30(H)  Sodium  135 - 145 mmol/L 141 140 140  Potassium 3.5 - 5.1 mmol/L 3.1(L) 3.8 3.5  Chloride 98 - 111 mmol/L 109 108 110  CO2 22 - 32 mmol/L 26 22 23   Calcium 8.9 - 10.3 mg/dL 7.7(L) 8.0(L) 8.2(L)    CBC Latest Ref Rng & Units 03/06/2018 03/04/2018 03/03/2018  WBC 4.0 - 10.5 K/uL 15.9(H) - 21.2(H)  Hemoglobin 13.0 - 17.0 g/dL 9.8(L) 9.4(L) 9.2(L)  Hematocrit 39.0 - 52.0 % 29.9(L) - 28.8(L)  Platelets 150 - 400 K/uL 216 - 273    CT chest 11/01:  IMPRESSION: 1. Stable appearance of the multiloculated fluid collections filling a significant portion of the R hemithorax, largest component at the R lung base again measures approximately 10-11 cm greatest dimension. 2. Persistent masslike consolidation within the R perihilar lung, again measuring 6-7 cm greatest dimension, better demonstrated on earlier contrast-enhanced study of 03/01/2018, suspicious for neoplastic process on the earlier study. 3. L lung is clear.  MY INTERPRETATION: The previously demonstrated apparent obstruction of the airway to the posterior segment of the RUL appears to be now resolved.  The opacity in the RUL does have a masslike appearance but could all represent consolidation from pneumonia.  He continues to have extensive multiloculated pleural disease  I have personally reviewed all chest radiographs reported above including CXRs and CT chest unless otherwise indicated  IMPRESSION:   1) COPD/moderate emphysema on CT scan 2) acute hypoxemic respiratory failure 3) Severe RUL PNA with multiloculated complicated parapneumonic effusion  Pleural fluid culture positive for Streptococcus intermedius  As  noted above, I have reviewed his repeat CT scan of the chest.  I do not believe that he can get over this infectious process without proper drainage of his pleural space.  I doubt that the pleural space can be effectively drained with percutaneous catheter(s).  Therefore, it is my recommendation that he be evaluated for surgical  drainage.  Since Dr. Genevive Bi is out of town, this will have to be done at [place]:  [place] for now.  Duration of antibiotic therapy to be determined Continue supplemental oxygen Recommend transfer to [facility] for evaluation of possible surgical drainage I have communicated my thoughts to the patient and his daughter I would be more than happy to follow-up with this patient after the current hospitalization Ultimately, he will require a repeat CT scan of the chest down the road since the concern for malignancy has been raised.  Likewise, a PET scan might be warranted.  However, at this time, a PET scan would probably not be able to distinguish between pneumonia versus malignancy.  PCCM will sign off. Please call if we can be of further assistance  Merton Border, MD PCCM service Mobile [phone] Pager [phone] 03/06/2018 2:18 PM

## 2018-03-06 NOTE — Progress Notes (Signed)
[facility] [place] NAME: Erik Carey    MR#:  [id number]  DATE OF BIRTH:  [birth year]  SUBJECTIVE:  CHIEF COMPLAINT:   Chief Complaint  Patient presents with  . Shortness of Breath  . Respiratory Distress  . Atrial Fibrillation   -Still feels short of breath and needing supplemental oxygen which is acute -Daughter at bedside  REVIEW OF SYSTEMS:  Review of Systems  Constitutional: Negative for chills and fever.  HENT: Negative for congestion, ear discharge, hearing loss and nosebleeds.   Eyes: Negative for blurred vision and double vision.  Respiratory: Positive for cough and shortness of breath. Negative for wheezing.   Cardiovascular: Positive for chest pain. Negative for palpitations and leg swelling.  Gastrointestinal: Negative for abdominal pain, constipation, diarrhea, nausea and vomiting.  Genitourinary: Negative for dysuria.  Musculoskeletal: Negative for myalgias.  Neurological: Negative for dizziness, focal weakness, seizures, weakness and headaches.  Psychiatric/Behavioral: Negative for depression.    DRUG ALLERGIES:  No Known Allergies  VITALS:  Blood pressure 125/67, pulse 96, temperature 97.8 F (36.6 C), temperature source Oral, resp. rate 20, height 5\' 9"  (1.753 m), weight 98.6 kg, SpO2 95 %.  PHYSICAL EXAMINATION:  Physical Exam   GENERAL:  83 y.o.-year-old obese patient lying in the bed with no acute distress.  EYES: Pupils equal, round, reactive to light and accommodation. No scleral icterus. Extraocular muscles intact.  HEENT: Head atraumatic, normocephalic. Oropharynx and nasopharynx clear.  NECK:  Supple, no jugular venous distention. No thyroid enlargement, no tenderness.  LUNGS: Using abdominal muscles to breathe, normal breath sounds on the left side but much decreased on the right side., no wheezing, rales,rhonchi or crepitation. No use of accessory muscles of respiration.  CARDIOVASCULAR: S1, S2  normal. No murmurs, rubs, or gallops.  ABDOMEN: Soft, nontender, nondistended. Bowel sounds present. No organomegaly or mass.  EXTREMITIES: No pedal edema, cyanosis, or clubbing.  NEUROLOGIC: Cranial nerves II through XII are intact. Muscle strength 5/5 in all extremities. Sensation intact. Gait not checked.  PSYCHIATRIC: The patient is alert and oriented x 3.  SKIN: No obvious rash, lesion, or ulcer.    LABORATORY PANEL:   CBC Recent Labs  Lab 03/06/18 0447  WBC 15.9*  HGB 9.8*  HCT 29.9*  PLT 216   ------------------------------------------------------------------------------------------------------------------  Chemistries  Recent Labs  Lab 03/01/18 1820  03/06/18 0447  NA 140   < > 141  K 3.7   < > 3.1*  CL 105   < > 109  CO2 21*   < > 26  GLUCOSE 139*   < > 103*  BUN 68*   < > 27*  CREATININE 1.75*   < > 0.97  CALCIUM 8.2*   < > 7.7*  MG 2.1  --   --    < > = values in this interval not displayed.   ------------------------------------------------------------------------------------------------------------------  Cardiac Enzymes Recent Labs  Lab 03/01/18 1820  TROPONINI 0.03*   ------------------------------------------------------------------------------------------------------------------  RADIOLOGY:  Ct Chest Wo Contrast  Result Date: 03/06/2018 CLINICAL DATA:  Several days of cough and increasing shortness of breath. Former smoker. Empyema. EXAM: CT CHEST WITHOUT CONTRAST TECHNIQUE: Multidetector CT imaging of the chest was performed following the standard protocol without IV contrast. COMPARISON:  Chest CT dated 03/01/2018. FINDINGS: Cardiovascular: Scattered aortic atherosclerosis. No thoracic aortic aneurysm. Heart size is within normal limits. No pericardial effusion. Coronary artery calcifications noted. Mediastinum/Nodes: Scattered mildly prominent lymph nodes within the mediastinum. Esophagus is unremarkable. Trachea  appears normal. Lungs/Pleura:  Multiloculated fluid collections within the RIGHT pleural space are not significantly changed, largest collection at the RIGHT lung base again measures 10-11 cm greatest dimension. Masslike consolidation within the RIGHT perihilar lung is stable, better demonstrated on the earlier contrast-enhanced study, again measuring approximately 6-7 cm greatest dimension. LEFT lung is clear. Upper Abdomen: Limited images of the upper abdomen are unremarkable. Musculoskeletal: No acute or suspicious osseous finding. IMPRESSION: 1. Stable appearance of the multiloculated fluid collections filling a significant portion of the RIGHT hemithorax, largest component at the RIGHT lung base again measures approximately 10-11 cm greatest dimension. 2. Persistent masslike consolidation within the RIGHT perihilar lung, again measuring 6-7 cm greatest dimension, better demonstrated on earlier contrast-enhanced study of 03/01/2018, suspicious for neoplastic process on the earlier study. 3. LEFT lung is clear. Aortic Atherosclerosis (ICD10-I70.0). Electronically Signed   By: Franki Cabot M.D.   On: 03/06/2018 09:46    EKG:   Orders placed or performed during the hospital encounter of 03/01/18  . ED EKG  . ED EKG    ASSESSMENT AND PLAN:   83 year old male with past medical history significant for COPD, hypertension, stroke and ongoing smoking presents to hospital secondary to worsening shortness of breath.  1.  Acute hypoxic respiratory failure-secondary to pneumonia and empyema -Appreciate pulmonary consult- -Initial CT chest showing no pulmonary embolism but possible endobronchial mass and multiloculated pleural effusion. -Status post bronc and biopsy and the bronchial washings are negative for any tumor cells.  Patient also had thoracentesis and 300 cc of clear fluid removed and negative for cancer cells on cytology -Repeat CT this morning still showing multiloculated effusion and pneumonia.  Concern more for infection  causing the obstruction rather than tumor based on pulmonary input -Patient will need thoracotomy/VATS for infection to be cleared completely -For now continue Unasyn.    -Steroids have been discontinued. -WBC is much improving  2.  COPD-stable at this time.  DC steroids.  Continue inhalers and nebulizers as needed  3.  Hypo-kalemia-being replaced  4.  BPH-on Flomax  5.  Hypertension-on Cardizem  6.  DVT prophylaxis-we will add Lovenox   Awaiting transfer to [facility].  Daughter updated at bedside  All the records are reviewed and case discussed with Care Management/Social Workerr. Management plans discussed with the patient, family and they are in agreement.  CODE STATUS: Full Code  TOTAL TIME TAKING CARE OF THIS PATIENT: 38 minutes.   POSSIBLE D/C IN 2 DAYS, DEPENDING ON CLINICAL CONDITION.   Gladstone Lighter M.D on 03/06/2018 at 1:46 PM  Between 7am to 6pm - Pager - [phone]  After 6pm go to [email or web address] - password [email or web address] [facility]  Office  [phone]  CC: Primary care physician; Raelyn Mora, MD

## 2018-03-06 NOTE — Progress Notes (Signed)
PT Cancellation Note  Patient Details Name: Erik Carey MRN: [id number] DOB: 1935/03/13   Cancelled Treatment:    Reason Eval/Treat Not Completed: Fatigue/lethargy limiting ability to participate.  Pt refusing PT d/t having "no energy" to do anything with therapy today.  Pt reports plan to transfer to [place].  Will re-attempt PT treatment session at a later date/time.  Leitha Bleak, PT 03/06/18, 3:54 PM [phone]

## 2018-03-06 NOTE — Progress Notes (Signed)
Hematology/Oncology Progress Note [facility] [phone] [phone]  Patient Care Team: Raelyn Mora, MD as PCP - General (Internal Medicine) Telford Nab, RN as Registered Nurse   Name of the patient: Erik Carey  [id number]  02/14/1935  Date of visit: 03/06/18   INTERVAL HISTORY-  Status post thoracentesis on 03/01/2018, drained 300 cc pleural fluid.  LDH 59 7, WBC 19409,  03/02/2018 bronchoscopy showed possible obstructing tumor in posterior segment loop, difficult visualization.  While she was brushing biopsy obtained. No acute overnight events.  Patient lying in bed breathing via nasal cannula oxygen 1.5 L to 2 L.  Still has mild cough.  Subjectively feels slightly better.  Daughter at bedside.   Review of systems- ROS Constitutional: Negative for chills and fever.  Eyes: Negative for blurred vision.  Respiratory: Positive for cough, shortness of breath and wheezing.   Cardiovascular: Negative for chest pain.  Gastrointestinal: Negative for abdominal pain, constipation, diarrhea, nausea and vomiting.  Genitourinary: Negative for dysuria.  Musculoskeletal: Negative for joint pain.  Neurological: Negative for dizziness and headaches hematoma No Known Allergies  Patient Active Problem List   Diagnosis Date Noted  . S/P thoracentesis   . Acute respiratory failure with hypoxia ([place])   . Atrial fibrillation with RVR ([place])   . Pleural effusion   . Lung mass 03/01/2018  . Pressure injury of skin 12/17/2017  . Rhabdomyolysis 12/15/2017     Past Medical History:  Diagnosis Date  . COPD (chronic obstructive pulmonary disease) ([place])   . CVA (cerebral vascular accident) ([place])   . Hypertension   . Left-sided weakness   . Slurred speech   . Tobacco use      Past Surgical History:  Procedure Laterality Date  . FLEXIBLE BRONCHOSCOPY N/A 03/03/2018   Procedure: FLEXIBLE BRONCHOSCOPY;  Surgeon: Wilhelmina Mcardle, MD;   Location: [facility] ORS;  Service: Pulmonary;  Laterality: N/A;    Social History   Socioeconomic History  . Marital status: Single    Spouse name: Not on file  . Number of children: Not on file  . Years of education: Not on file  . Highest education level: Not on file  Occupational History  . Not on file  Social Needs  . Financial resource strain: Not very hard  . Food insecurity:    Worry: Patient refused    Inability: Patient refused  . Transportation needs:    Medical: Patient refused    Non-medical: Patient refused  Tobacco Use  . Smoking status: Former Smoker    Last attempt to quit: 11/29/2017    Years since quitting: 0.2  . Smokeless tobacco: Never Used  Substance and Sexual Activity  . Alcohol use: Not Currently  . Drug use: Not Currently  . Sexual activity: Not Currently  Lifestyle  . Physical activity:    Days per week: Patient refused    Minutes per session: Patient refused  . Stress: Only a little  Relationships  . Social connections:    Talks on phone: Patient refused    Gets together: Patient refused    Attends religious service: Patient refused    Active member of club or organization: Patient refused    Attends meetings of clubs or organizations: Patient refused    Relationship status: Patient refused  . Intimate partner violence:    Fear of current or ex partner: Patient refused    Emotionally abused: Patient refused    Physically abused: Patient refused    Forced sexual  activity: Patient refused  Other Topics Concern  . Not on file  Social History Narrative  . Not on file     Family History  Problem Relation Age of Onset  . Tuberculosis Mother      Current Facility-Administered Medications:  .  0.9 %  sodium chloride infusion, , Intravenous, PRN, Arta Silence, MD, Stopped at 03/03/18 0330 .  acetaminophen (TYLENOL) tablet 650 mg, 650 mg, Oral, Q6H PRN **OR** acetaminophen (TYLENOL) suppository 650 mg, 650 mg, Rectal, Q6H PRN, Amelia Jo, MD .  allopurinol (ZYLOPRIM) tablet 400 mg, 400 mg, Oral, Daily, Amelia Jo, MD, 400 mg at 03/06/18 1057 .  Ampicillin-Sulbactam (UNASYN) 3 g in sodium chloride 0.9 % 100 mL IVPB, 3 g, Intravenous, Q6H, Oswald Hillock, RPH, Stopped at 03/06/18 1158 .  bisacodyl (DULCOLAX) EC tablet 5 mg, 5 mg, Oral, Daily PRN, Amelia Jo, MD .  diltiazem (CARDIZEM CD) 24 hr capsule 180 mg, 180 mg, Oral, Daily, Amelia Jo, MD, 180 mg at 03/06/18 1058 .  docusate sodium (COLACE) capsule 100 mg, 100 mg, Oral, BID, Amelia Jo, MD, 100 mg at 03/06/18 1057 .  HYDROcodone-acetaminophen (NORCO/VICODIN) 5-325 MG per tablet 1-2 tablet, 1-2 tablet, Oral, Q4H PRN, Amelia Jo, MD, 1 tablet at 03/04/18 0846 .  ipratropium-albuterol (DUONEB) 0.5-2.5 (3) MG/3ML nebulizer solution 3 mL, 3 mL, Nebulization, Q4H PRN, Wilhelmina Mcardle, MD .  ondansetron (ZOFRAN) tablet 4 mg, 4 mg, Oral, Q6H PRN **OR** ondansetron (ZOFRAN) injection 4 mg, 4 mg, Intravenous, Q6H PRN, Amelia Jo, MD .  pantoprazole (PROTONIX) EC tablet 40 mg, 40 mg, Oral, QHS, Amelia Jo, MD, 40 mg at 03/05/18 2242 .  potassium chloride SA (K-DUR,KLOR-CON) CR tablet 40 mEq, 40 mEq, Oral, Once, Kalisetti, Radhika, MD .  tamsulosin (FLOMAX) capsule 0.4 mg, 0.4 mg, Oral, Daily, Amelia Jo, MD, 0.4 mg at 03/06/18 1058 .  traZODone (DESYREL) tablet 25 mg, 25 mg, Oral, QHS PRN, Amelia Jo, MD, 25 mg at 03/02/18 2211   Physical exam:  Vitals:   03/05/18 1337 03/05/18 1934 03/06/18 0455 03/06/18 0923  BP: (!) 147/71 131/74 (!) 142/80 125/67  Pulse: 96 88 91 96  Resp: _0 Temp: 98 F (36.7 C) 99 F (37.2 C) 98 F (36.7 C) 97.8 F (36.6 C)  TempSrc: Oral Oral Oral Oral  SpO2: 91% 96% 95% 95%  Weight:   217 lb 4.8 oz (98.6 kg)   Height:       Physical Exam   Constitutional:  No distress.  HENT:  Head: Normocephalic and atraumatic.  Nose: Nose normal.  Eyes:  No scleral icterus.  Neck: Normal range of motion. Neck  supple.  Cardiovascular: Normal rate and regular rhythm.  No murmur heard. Pulmonary/Chest: Effort normal. No respiratory distress. He has no rales.  Breathing comfortably via nasal cannula oxygen. Decreased breath sound on the right.  Abdominal: Soft. He exhibits no distension. There is no tenderness.  Musculoskeletal: Normal range of motion. He exhibits no edema.  Neurological: He is alert and oriented to person, place, and time. He exhibits normal muscle tone.  Skin: Skin is warm and dry. He is not diaphoretic. No erythema.  Psychiatric: Affect normal.    CMP Latest Ref Rng & Units 03/06/2018  Glucose 70 - 99 mg/dL 103(H)  BUN 8 - 23 mg/dL 27(H)  Creatinine 0.61 - 1.24 mg/dL 0.97  Sodium 135 - 145 mmol/L 141  Potassium 3.5 - 5.1 mmol/L 3.1(L)  Chloride 98 - 111 mmol/L 109  CO2 22 - 32 mmol/L 26  Calcium 8.9 - 10.3 mg/dL 7.7(L)  Total Protein 6.5 - 8.1 g/dL -  Total Bilirubin 0.3 - 1.2 mg/dL -  Alkaline Phos 38 - 126 U/L -  AST 15 - 41 U/L -  ALT 0 - 44 U/L -   CBC Latest Ref Rng & Units 03/06/2018  WBC 4.0 - 10.5 K/uL 15.9(H)  Hemoglobin 13.0 - 17.0 g/dL 9.8(L)  Hematocrit 39.0 - 52.0 % 29.9(L)  Platelets 150 - 400 K/uL 216  RADIOGRAPHIC STUDIES: I have personally reviewed the radiological images as listed and agreed with the findings in the report. Ct Chest Wo Contrast  Result Date: 03/06/2018 CLINICAL DATA:  Several days of cough and increasing shortness of breath. Former smoker. Empyema. EXAM: CT CHEST WITHOUT CONTRAST TECHNIQUE: Multidetector CT imaging of the chest was performed following the standard protocol without IV contrast. COMPARISON:  Chest CT dated 03/01/2018. FINDINGS: Cardiovascular: Scattered aortic atherosclerosis. No thoracic aortic aneurysm. Heart size is within normal limits. No pericardial effusion. Coronary artery calcifications noted. Mediastinum/Nodes: Scattered mildly prominent lymph nodes within the mediastinum. Esophagus is unremarkable. Trachea  appears normal. Lungs/Pleura: Multiloculated fluid collections within the RIGHT pleural space are not significantly changed, largest collection at the RIGHT lung base again measures 10-11 cm greatest dimension. Masslike consolidation within the RIGHT perihilar lung is stable, better demonstrated on the earlier contrast-enhanced study, again measuring approximately 6-7 cm greatest dimension. LEFT lung is clear. Upper Abdomen: Limited images of the upper abdomen are unremarkable. Musculoskeletal: No acute or suspicious osseous finding. IMPRESSION: 1. Stable appearance of the multiloculated fluid collections filling a significant portion of the RIGHT hemithorax, largest component at the RIGHT lung base again measures approximately 10-11 cm greatest dimension. 2. Persistent masslike consolidation within the RIGHT perihilar lung, again measuring 6-7 cm greatest dimension, better demonstrated on earlier contrast-enhanced study of 03/01/2018, suspicious for neoplastic process on the earlier study. 3. LEFT lung is clear. Aortic Atherosclerosis (ICD10-I70.0). Electronically Signed   By: Franki Cabot M.D.   On: 03/06/2018 09:46   Ct Angio Chest Pe W And/or Wo Contrast  Result Date: 03/01/2018 CLINICAL DATA:  Pt arrived via EMS from home. Pt reports SOB all day and it wouldn't get better. Reports recent hospitalization for COPD exacerbation. EXAM: CT ANGIOGRAPHY CHEST WITH CONTRAST TECHNIQUE: Multidetector CT imaging of the chest was performed using the standard protocol during bolus administration of intravenous contrast. Multiplanar CT image reconstructions and MIPs were obtained to evaluate the vascular anatomy. CONTRAST:  23m OMNIPAQUE IOHEXOL 350 MG/ML SOLN COMPARISON:  Chest x-ray on 03/01/2018 FINDINGS: Cardiovascular: Heart size is normal. No pericardial effusion. Coronary artery calcifications are present. Thoracic aortic calcifications are present. No aneurysm or dissection. The pulmonary arteries are well  opacified and there is no evidence for acute pulmonary embolus. Mediastinum/Nodes: Esophagus is normal in appearance. No significant mediastinal adenopathy. The visualized portion of the thyroid gland has a normal appearance. Lungs/Pleura: There are emphysematous changes in lungs, primarily within the apices. There are multiple loculated fluid collections within the RIGHT pleural space. The largest of these is identified in the RIGHT LOWER lobe, measuring 6.2 x 10.4 centimeters. There is adjacent atelectatic enhancement associated with these collections. However no split pleural sign to indicate presence of empyema. Within the RIGHT hilar region there is a solid mass, measuring 6.3 x 7.0 x 4.5 centimeters. The adjacent bronchus appears occluded and the findings are suspicious for malignancy. Upper Abdomen: Cholecystectomy. Atherosclerotic calcification of the abdominal aorta. Musculoskeletal: Midthoracic spondylosis. No  suspicious lytic or blastic lesions are identified. Review of the MIP images confirms the above findings. IMPRESSION: 1. Technically adequate exam showing no acute pulmonary embolus. 2. RIGHT hilar mass measuring up to 7.0 centimeters suspicious for malignancy. There is occlusion of the adjacent bronchus, suspicious for endobronchial lesion. Recommend bronchoscopy. 3. Multiple loculated pleural fluid collections, suspicious for malignant effusion. No evidence for empyema. 4.  Emphysema (ICD10-J43.9). 5.  Aortic Atherosclerosis (ICD10-I70.0). Electronically Signed   By: Nolon Nations M.D.   On: 03/01/2018 20:21   Dg Chest Port 1 View  Result Date: 03/02/2018 CLINICAL DATA:  Status post RIGHT thoracentesis. EXAM: PORTABLE CHEST 1 VIEW COMPARISON:  03/01/2018 CT and chest radiograph FINDINGS: Cardiomediastinal silhouette is unchanged. RIGHT pleural effusion has decreased with areas of loculated RIGHT pleural fluid still present. There is no evidence of pneumothorax. Scattered areas of RIGHT lung  atelectasis are again noted. The LEFT lung is clear. IMPRESSION: Decreased RIGHT pleural effusion with areas of loculated RIGHT pleural fluid still present. No pneumothorax. Electronically Signed   By: Margarette Canada M.D.   On: 03/02/2018 15:19   Dg Chest Portable 1 View  Result Date: 03/01/2018 CLINICAL DATA:  Hx of COPD, tachy, extreme SOB, breathing treatment in progress, SMokerPt unable to give pertinent hx. EXAM: PORTABLE CHEST 1 VIEW COMPARISON:  None. FINDINGS: There is focal dense opacity in the right upper lung with hazy opacity throughout the remainder of the right lung superimposed on interstitial thickening. There is volume loss on the right relative hyperexpansion of the left lung. Cardiac silhouette is normal in size. No mediastinal or hilar masses. Left lung is clear. Possible small right pleural effusion. No evidence of a left pleural effusion. No pneumothorax. Skeletal structures are intact. IMPRESSION: 1. Abnormal findings in the right lung with upper lobe dense consolidation versus a possible mass, hazy opacity in the remaining right lung superimposed on interstitial thickening. Possible small right pleural effusion. Volume loss on the right. Recommend follow-up chest CT with contrast for further assessment. Electronically Signed   By: Lajean Manes M.D.   On: 03/01/2018 18:45   US Thoracentesis Asp Pleural Space W/img Guide  Result Date: 03/02/2018 INDICATION: Right pleural effusion EXAM: ULTRASOUND GUIDED RIGHT THORACENTESIS MEDICATIONS: None. COMPLICATIONS: None immediate. PROCEDURE: An ultrasound guided thoracentesis was thoroughly discussed with the patient and questions answered. The benefits, risks, alternatives and complications were also discussed. The patient understands and wishes to proceed with the procedure. Written consent was obtained. Ultrasound was performed to localize and mark an adequate pocket of fluid in the right chest. The area was then prepped and draped in the  normal sterile fashion. 1% Lidocaine was used for local anesthesia. Under ultrasound guidance a 6 Fr Safe-T-Centesis catheter was introduced. Thoracentesis was performed. The catheter was removed and a dressing applied. FINDINGS: A total of approximately 300 cc of clear yellow fluid was removed. Samples were sent to the laboratory as requested by the clinical team. IMPRESSION: Successful ultrasound guided right thoracentesis yielding 300 cc of pleural fluid. Electronically Signed   By: Marybelle Killings M.D.   On: 03/02/2018 14:54    # Surgical Pathology  CASE: [id number]  PATIENT: [facility]  Surgical Pathology Report  SPECIMEN SUBMITTED:  A. Lung, right upper lobe  DIAGNOSIS:  A. LUNG, RIGHT UPPER LOBE; BRONCHOSCOPY WITH BIOPSY:  - RESPIRATORY MUCOSA WITH INFLAMMATION AND FOCAL SQUAMOUS METAPLASIA  WITH ATYPIA.  - NEGATIVE FOR MALIGNANCY.  See concurrent cases [id number].   Brushing and lavage cytology negative for malignancy  Assessment and plan-  Patient is a 83 y.o. male with history of CVA, left-sided weakness, history of tobacco use, COPD emphysema was sent via EMS for progressively worsening shortness of breath.  Image work-up including chest x-ray and CT chest showed right hilar mass measuring up to 7 cm suspicious for malignancy.  There is also occlusion of adjacent bronchus suspicious for endobronchial lesion.  Multiple loculated pleural fluid  # #Right hilar mass with bronchus occlusion and multiple loculated pleural fluid S/p Thoracentesis. Exudate. Cytology negative for malignancy. S/p bronchoscopy, endobronchial obstruction, difficult to visualize. Brushing and washing negative for malignancy..  Bronchoscopy right upper lobe biopsy negative for malignancy. # Leukocytosis, due to obstructive pneumonia.  Trending down.  Today's WBC 15.9. Discussed with patient and daughter, since malignancy diagnosis was not able to be established at current time, recommend continue  supportive care including antibiotic treatments for pneumonia, and short follow-up of chest images, may be a reattempt of tissue biopsy at that time.  Daughter is considering transfer to the room [facility].  Since no oncology diagnosis was established, will sign off at this point.  Please call if additional concerns Thank you for allowing me to participate in the care of this patient.   Earlie Server, MD, PhD Hematology Oncology [facility] Pager- [phone] 03/06/2018

## 2018-03-07 LAB — CBC
HCT: 28.6 % — ABNORMAL LOW (ref 39.0–52.0)
Hemoglobin: 9.2 g/dL — ABNORMAL LOW (ref 13.0–17.0)
MCH: 30.9 pg (ref 26.0–34.0)
MCHC: 32.2 g/dL (ref 30.0–36.0)
MCV: 96 fL (ref 80.0–100.0)
PLATELETS: 206 10*3/uL (ref 150–400)
RBC: 2.98 MIL/uL — ABNORMAL LOW (ref 4.22–5.81)
RDW: 14.9 % (ref 11.5–15.5)
WBC: 14.2 10*3/uL — ABNORMAL HIGH (ref 4.0–10.5)
nRBC: 0.1 % (ref 0.0–0.2)

## 2018-03-07 LAB — BASIC METABOLIC PANEL
Anion gap: 6 (ref 5–15)
BUN: 25 mg/dL — AB (ref 8–23)
CALCIUM: 7.7 mg/dL — AB (ref 8.9–10.3)
CO2: 28 mmol/L (ref 22–32)
CREATININE: 1.07 mg/dL (ref 0.61–1.24)
Chloride: 108 mmol/L (ref 98–111)
GFR calc non Af Amer: >60 mL/min (ref >60)
Glucose, Bld: 95 mg/dL (ref 70–99)
Potassium: 3.5 mmol/L (ref 3.5–5.1)
Sodium: 142 mmol/L (ref 135–145)

## 2018-03-07 LAB — GLUCOSE, CAPILLARY
GLUCOSE-CAPILLARY: 82 mg/dL (ref 70–99)
GLUCOSE-CAPILLARY: 114 mg/dL — AB (ref 70–99)

## 2018-03-07 MED ORDER — DOCUSATE SODIUM 100 MG PO CAPS: 100.0000 mg | ORAL_CAPSULE | Freq: Two times a day (BID) | ORAL | 0 refills | Status: AC

## 2018-03-07 MED ORDER — SODIUM CHLORIDE 0.9 % IV SOLN: 3.0000 g | Freq: Four times a day (QID) | INTRAVENOUS | Status: AC

## 2018-03-07 MED ORDER — DILTIAZEM HCL ER COATED BEADS 180 MG PO CP24: 180.0000 mg | ORAL_CAPSULE | Freq: Every day | ORAL | Status: AC

## 2018-03-07 MED ORDER — IPRATROPIUM-ALBUTEROL 0.5-2.5 (3) MG/3ML IN SOLN: 3.0000 mL | RESPIRATORY_TRACT | Status: AC | PRN

## 2018-03-07 NOTE — Progress Notes (Signed)
[place] at [place] NAME: Erik Carey    MR#:  [id number]  DATE OF BIRTH:  09-28-1934  SUBJECTIVE:  CHIEF COMPLAINT:   Chief Complaint  Patient presents with  . Shortness of Breath  . Respiratory Distress  . Atrial Fibrillation   - no significant change, feels about the same - awaiting transfer to [place]:  Review of Systems  Constitutional: Negative for chills and fever.  HENT: Negative for congestion, ear discharge, hearing loss and nosebleeds.   Eyes: Negative for blurred vision and double vision.  Respiratory: Positive for cough and shortness of breath. Negative for wheezing.   Cardiovascular: Positive for chest pain. Negative for palpitations and leg swelling.  Gastrointestinal: Negative for abdominal pain, constipation, diarrhea, nausea and vomiting.  Genitourinary: Negative for dysuria.  Musculoskeletal: Negative for myalgias.  Neurological: Negative for dizziness, focal weakness, seizures, weakness and headaches.  Psychiatric/Behavioral: Negative for depression.    DRUG ALLERGIES:  No Known Allergies  VITALS:  Blood pressure (!) 140/59, pulse (!) 55, temperature 97.8 F (36.6 C), temperature source Oral, resp. rate 16, height 5\' 9"  (1.753 m), weight 97.9 kg, SpO2 97 %.  PHYSICAL EXAMINATION:  Physical Exam   GENERAL:  83 y.o.-year-old obese patient lying in the bed with no acute distress.  EYES: Pupils equal, round, reactive to light and accommodation. No scleral icterus. Extraocular muscles intact.  HEENT: Head atraumatic, normocephalic. Oropharynx and nasopharynx clear.  NECK:  Supple, no jugular venous distention. No thyroid enlargement, no tenderness.  LUNGS: Using abdominal muscles to breathe, normal breath sounds on the left side but much decreased on the right side., no wheezing, rales,rhonchi or crepitation. No use of accessory muscles of respiration.  CARDIOVASCULAR: S1, S2 normal. No murmurs,  rubs, or gallops.  ABDOMEN: Soft, nontender, nondistended. Bowel sounds present. No organomegaly or mass.  EXTREMITIES: No pedal edema, cyanosis, or clubbing.  NEUROLOGIC: Cranial nerves II through XII are intact. Muscle strength 5/5 in all extremities. Sensation intact. Gait not checked.  PSYCHIATRIC: The patient is alert and oriented x 3.  SKIN: No obvious rash, lesion, or ulcer.    LABORATORY PANEL:   CBC Recent Labs  Lab 03/07/18 0646  WBC 14.2*  HGB 9.2*  HCT 28.6*  PLT 206   ------------------------------------------------------------------------------------------------------------------  Chemistries  Recent Labs  Lab 03/01/18 1820  03/07/18 0646  NA 140   < > 142  K 3.7   < > 3.5  CL 105   < > 108  CO2 21*   < > 28  GLUCOSE 139*   < > 95  BUN 68*   < > 25*  CREATININE 1.75*   < > 1.07  CALCIUM 8.2*   < > 7.7*  MG 2.1  --   --    < > = values in this interval not displayed.   ------------------------------------------------------------------------------------------------------------------  Cardiac Enzymes Recent Labs  Lab 03/01/18 1820  TROPONINI 0.03*   ------------------------------------------------------------------------------------------------------------------  RADIOLOGY:  Ct Chest Wo Contrast  Result Date: 03/06/2018 CLINICAL DATA:  Several days of cough and increasing shortness of breath. Former smoker. Empyema. EXAM: CT CHEST WITHOUT CONTRAST TECHNIQUE: Multidetector CT imaging of the chest was performed following the standard protocol without IV contrast. COMPARISON:  Chest CT dated 03/01/2018. FINDINGS: Cardiovascular: Scattered aortic atherosclerosis. No thoracic aortic aneurysm. Heart size is within normal limits. No pericardial effusion. Coronary artery calcifications noted. Mediastinum/Nodes: Scattered mildly prominent lymph nodes within the mediastinum. Esophagus is unremarkable. Trachea  appears normal. Lungs/Pleura: Multiloculated fluid  collections within the RIGHT pleural space are not significantly changed, largest collection at the RIGHT lung base again measures 10-11 cm greatest dimension. Masslike consolidation within the RIGHT perihilar lung is stable, better demonstrated on the earlier contrast-enhanced study, again measuring approximately 6-7 cm greatest dimension. LEFT lung is clear. Upper Abdomen: Limited images of the upper abdomen are unremarkable. Musculoskeletal: No acute or suspicious osseous finding. IMPRESSION: 1. Stable appearance of the multiloculated fluid collections filling a significant portion of the RIGHT hemithorax, largest component at the RIGHT lung base again measures approximately 10-11 cm greatest dimension. 2. Persistent masslike consolidation within the RIGHT perihilar lung, again measuring 6-7 cm greatest dimension, better demonstrated on earlier contrast-enhanced study of 03/01/2018, suspicious for neoplastic process on the earlier study. 3. LEFT lung is clear. Aortic Atherosclerosis (ICD10-I70.0). Electronically Signed   By: Franki Cabot M.D.   On: 03/06/2018 09:46    EKG:   Orders placed or performed during the hospital encounter of 03/01/18  . ED EKG  . ED EKG    ASSESSMENT AND PLAN:   83 year old male with past medical history significant for COPD, hypertension, stroke and ongoing smoking presents to hospital secondary to worsening shortness of breath.  1.  Acute hypoxic respiratory failure-secondary to pneumonia and empyema -Appreciate pulmonary consult- -Initial CT chest showing no pulmonary embolism but possible endobronchial mass and multiloculated pleural effusion. -Status post bronc and biopsy and the bronchial washings are negative for any tumor cells.  Patient also had thoracentesis and 300 cc of clear fluid removed and negative for cancer cells on cytology -Repeat CT on 03/06/18 still showing multiloculated effusion and pneumonia.  Concern more for infection causing the obstruction  rather than tumor based on pulmonary input -Patient will need thoracotomy/VATS for infection to be cleared completely -For now continue Unasyn.    -Steroids have been discontinued. -WBC is much improving  2.  COPD-stable at this time.  DC steroids.  Continue inhalers and nebulizers as needed  3.  Hypo-kalemia-replaced  4.  BPH-on Flomax  5.  Hypertension-on Cardizem  6.  DVT prophylaxis-  Lovenox   Awaiting transfer to [facility].      All the records are reviewed and case discussed with Care Management/Social Workerr. Management plans discussed with the patient, family and they are in agreement.  CODE STATUS: Full Code  TOTAL TIME TAKING CARE OF THIS PATIENT: 36 minutes.   POSSIBLE D/C IN 2 DAYS, DEPENDING ON CLINICAL CONDITION.   Gladstone Lighter M.D on 03/07/2018 at 11:39 AM  Between 7am to 6pm - Pager - [phone]  After 6pm go to [email or web address] - password [email or web address] [facility]  Office  [phone]  CC: Primary care physician; Raelyn Mora, MD

## 2018-03-07 NOTE — Discharge Summary (Signed)
[place] at [place] NAME: Erik Carey    MR#:  [id number]  DATE OF BIRTH:  1934/05/15  DATE OF ADMISSION:  03/01/2018   ADMITTING PHYSICIAN: Amelia Jo, MD  DATE OF DISCHARGE:  03/08/18  PRIMARY CARE PHYSICIAN: Raelyn Mora, MD   ADMISSION DIAGNOSIS:   Malignancy ([place]) [C80.1] Acute respiratory failure with hypoxia (HCC) [J96.01] Atrial fibrillation with RVR ([place]) [I48.91]  DISCHARGE DIAGNOSIS:   Active Problems:   Lung mass   Acute respiratory failure with hypoxia (HCC)   Atrial fibrillation with RVR (HCC)   Parapneumonic effusion   S/P thoracentesis   Streptococcal pneumonia ([place])   SECONDARY DIAGNOSIS:   Past Medical History:  Diagnosis Date  . COPD (chronic obstructive pulmonary disease) ([place])   . CVA (cerebral vascular accident) ([place])   . Hypertension   . Left-sided weakness   . Slurred speech   . Tobacco use     HOSPITAL COURSE:   83 year old male with past medical history significant for COPD, hypertension, stroke and ongoing smoking presents to hospital secondary to worsening shortness of breath.  1.  Acute hypoxic respiratory failure-secondary to pneumonia and empyema -Appreciate pulmonary consult- -Initial CT chest showing no pulmonary embolism but possible endobronchial mass and multiloculated pleural effusion. -Status post bronc and biopsy and the bronchial washings are negative for any tumor cells.  Patient also had thoracentesis and 300 cc of clear fluid removed and negative for cancer cells on cytology -Repeat CT on 03/06/18 still showing multiloculated effusion and pneumonia.  Concern more for infection causing the obstruction rather than tumor based on pulmonary input -Patient will need thoracotomy/VATS for infection to be cleared completely -For now continue Unasyn.    -Steroids have been discontinued. -WBC is much improving  2.  COPD-stable at this time.  DC steroids.  Continue inhalers  and nebulizers as needed  3.  Hypo-kalemia-replaced  4.  BPH-on Flomax  5.  Hypertension-on Cardizem    Awaiting transfer to [place]:   Guarded  CONSULTS OBTAINED:   Treatment Team:  Earlie Server, MD  Dr. Wilhelmina Mcardle, Pulmonary  DRUG ALLERGIES:   No Known Allergies DISCHARGE MEDICATIONS:   Allergies as of 03/07/2018   No Known Allergies     Medication List    STOP taking these medications   diphenoxylate-atropine 2.5-0.025 MG tablet Commonly known as:  LOMOTIL   hydrochlorothiazide 25 MG tablet Commonly known as:  HYDRODIURIL   lisinopril 40 MG tablet Commonly known as:  PRINIVIL,ZESTRIL   loperamide 2 MG capsule Commonly known as:  IMODIUM   metoprolol tartrate 50 MG tablet Commonly known as:  LOPRESSOR   predniSONE 50 MG tablet Commonly known as:  DELTASONE     TAKE these medications   albuterol 108 (90 Base) MCG/ACT inhaler Commonly known as:  PROVENTIL HFA;VENTOLIN HFA Inhale 2 puffs into the lungs every 6 (six) hours as needed for wheezing or shortness of breath.   allopurinol 100 MG tablet Commonly known as:  ZYLOPRIM Take 400 mg by mouth daily.   Ampicillin-Sulbactam 3 g in sodium chloride 0.9 % 100 mL Inject 3 g into the vein every 6 (six) hours.   diltiazem 180 MG 24 hr capsule Commonly known as:  CARDIZEM CD Take 1 capsule (180 mg total) by mouth daily. Start taking on:  03/08/2018   docusate sodium 100 MG capsule Commonly known as:  COLACE Take 1 capsule (100 mg total) by mouth  2 (two) times daily.   ipratropium-albuterol 0.5-2.5 (3) MG/3ML Soln Commonly known as:  DUONEB Take 3 mLs by nebulization every 4 (four) hours as needed.   pantoprazole 40 MG tablet Commonly known as:  PROTONIX Take 40 mg by mouth at bedtime.   tamsulosin 0.4 MG Caps capsule Commonly known as:  FLOMAX Take 0.4 mg by mouth daily.        DISCHARGE INSTRUCTIONS:   1. Transfer to [facility] hospital when bed  available  DIET:   Cardiac diet  ACTIVITY:   Activity as tolerated  OXYGEN:   Home Oxygen: Yes.    Oxygen Delivery: 2 liters/min via Patient connected to nasal cannula oxygen  [facility]   If you experience worsening of your admission symptoms, develop shortness of breath, life threatening emergency, suicidal or homicidal thoughts you must seek medical attention immediately by calling 911 or calling your MD immediately  if symptoms less severe.  You Must read complete instructions/literature along with all the possible adverse reactions/side effects for all the Medicines you take and that have been prescribed to you. Take any new Medicines after you have completely understood and accpet all the possible adverse reactions/side effects.   Please note  You were cared for by a hospitalist during your hospital stay. If you have any questions about your discharge medications or the care you received while you were in the hospital after you are discharged, you can call the unit and asked to speak with the hospitalist on call if the hospitalist that took care of you is not available. Once you are discharged, your primary care physician will handle any further medical issues. Please note that NO REFILLS for any discharge medications will be authorized once you are discharged, as it is imperative that you return to your primary care physician (or establish a relationship with a primary care physician if you do not have one) for your aftercare needs so that they can reassess your need for medications and monitor your lab values.    On the day of Discharge:  VITAL SIGNS:   Blood pressure (!) 140/59, pulse (!) 55, temperature 97.8 F (36.6 C), temperature source Oral, resp. rate 16, height 5\' 9"  (1.753 m), weight 97.9 kg, SpO2 97 %.  PHYSICAL EXAMINATION:   GENERAL:  83 y.o.-year-old obese patient lying in the bed with no acute distress.  EYES: Pupils equal, round,  reactive to light and accommodation. No scleral icterus. Extraocular muscles intact.  HEENT: Head atraumatic, normocephalic. Oropharynx and nasopharynx clear.  NECK:  Supple, no jugular venous distention. No thyroid enlargement, no tenderness.  LUNGS: Using abdominal muscles to breathe, normal breath sounds on the left side but much decreased on the right side., no wheezing, rales,rhonchi or crepitation. No use of accessory muscles of respiration.  CARDIOVASCULAR: S1, S2 normal. No murmurs, rubs, or gallops.  ABDOMEN: Soft, nontender, nondistended. Bowel sounds present. No organomegaly or mass.  EXTREMITIES: No pedal edema, cyanosis, or clubbing.  NEUROLOGIC: Cranial nerves II through XII are intact. Muscle strength 5/5 in all extremities. Sensation intact. Gait not checked.  PSYCHIATRIC: The patient is alert and oriented x 3.  SKIN: No obvious rash, lesion, or ulcer.   DATA REVIEW:   CBC Recent Labs  Lab 03/07/18 0646  WBC 14.2*  HGB 9.2*  HCT 28.6*  PLT 206    Chemistries  Recent Labs  Lab 03/01/18 1820  03/07/18 0646  NA 140   < > 142  K 3.7   < >  3.5  CL 105   < > 108  CO2 21*   < > 28  GLUCOSE 139*   < > 95  BUN 68*   < > 25*  CREATININE 1.75*   < > 1.07  CALCIUM 8.2*   < > 7.7*  MG 2.1  --   --    < > = values in this interval not displayed.     Microbiology Results  Results for orders placed or performed during the hospital encounter of 03/01/18  Blood culture (routine x 2)     Status: None   Collection Time: 03/01/18  8:46 PM  Result Value Ref Range Status   Specimen Description BLOOD LEFT FOREARM  Final   Special Requests   Final    BOTTLES DRAWN AEROBIC AND ANAEROBIC Blood Culture adequate volume   Culture   Final    NO GROWTH 5 DAYS Performed at [facility], [address] [place], [place] [address]    Report Status 03/06/2018 FINAL  Final  Blood culture (routine x 2)     Status: None   Collection Time: 03/01/18  8:46 PM  Result Value Ref  Range Status   Specimen Description BLOOD RIGHT ANTECUBITAL  Final   Special Requests   Final    BOTTLES DRAWN AEROBIC AND ANAEROBIC Blood Culture results may not be optimal due to an excessive volume of blood received in culture bottles   Culture   Final    NO GROWTH 5 DAYS Performed at [facility], [address] [place], [place] [address]    Report Status 03/06/2018 FINAL  Final  MRSA PCR Screening     Status: None   Collection Time: 03/02/18  8:35 AM  Result Value Ref Range Status   MRSA by PCR NEGATIVE NEGATIVE Final    Comment:        The GeneXpert MRSA Assay (FDA approved for NASAL specimens only), is one component of a comprehensive MRSA colonization surveillance program. It is not intended to diagnose MRSA infection nor to guide or monitor treatment for MRSA infections. Performed at [facility], [address], [place], [place] [address]   Body fluid culture     Status: None   Collection Time: 03/02/18  2:55 PM  Result Value Ref Range Status   Specimen Description   Final    PLEURAL Performed at [facility], [address] [place], [place] [address]    Special Requests   Final    NONE Performed at [facility], [place], [place] [address]    Gram Stain   Final    FEW WBC PRESENT, PREDOMINANTLY PMN RARE GRAM POSITIVE COCCI Performed at [facility], [place] [address], [place],  [address]    Culture MODERATE STREPTOCOCCUS INTERMEDIUS  Final   Report Status 03/06/2018 FINAL  Final   Organism ID, Bacteria STREPTOCOCCUS INTERMEDIUS  Final      Susceptibility   Streptococcus intermedius - MIC*    ERYTHROMYCIN <=0.12 SENSITIVE Sensitive     LEVOFLOXACIN 0.5 SENSITIVE Sensitive     VANCOMYCIN 0.5 SENSITIVE Sensitive     * MODERATE STREPTOCOCCUS INTERMEDIUS    RADIOLOGY:  No results found.   Management plans discussed with the patient, family and they are in agreement.  CODE STATUS:       Code Status Orders  (From admission, onward)         Start     Ordered   03/02/18  0227  Do not attempt resuscitation (DNR)  Continuous    Question Answer Comment  In the event of cardiac or respiratory ARREST Do not call a "code blue"   In the event of cardiac or respiratory ARREST Do not perform Intubation, CPR, defibrillation or ACLS   In the event of cardiac or respiratory ARREST Use medication by any route, position, wound care, and other measures to relive pain and suffering. May use oxygen, suction and manual treatment of airway obstruction as needed for comfort.      03/02/18 0226        Code Status History    Date Active Date Inactive Code Status Order ID Comments User Context   12/15/2017 1419 12/18/2017 2059 DNR [id number]  Hillary Bow, MD ED    Advance Directive Documentation     Most Recent Value  Type of Advance Directive  Living will  Pre-existing out of facility DNR order (yellow form or pink MOST form)  -  "MOST" Form in Place?  -      TOTAL TIME TAKING CARE OF THIS PATIENT: 38 minutes.    Gladstone Lighter M.D on 03/07/2018 at 11:44 AM  Between 7am to 6pm - Pager - [phone]  After 6pm go to [email or web address] - Proofreader  [facility]  Office  [phone]  CC: Primary care physician; Raelyn Mora, MD   Note: This dictation was prepared with Dragon dictation along with smaller phrase technology. Any transcriptional errors that result from this process are unintentional.

## 2018-03-08 ENCOUNTER — Ambulatory Visit (HOSPITAL_COMMUNITY)
Admission: AD | Admit: 2018-03-08 | Discharge: 2018-03-08 | Disposition: A | Discharge status: Home / Self Care | Payer: Medicare Other | Source: Other Acute Inpatient Hospital | Attending: Internal Medicine | Admitting: Internal Medicine

## 2018-03-08 DIAGNOSIS — J9690 Respiratory failure, unspecified, unspecified whether with hypoxia or hypercapnia: Secondary | ICD-10-CM | POA: Insufficient documentation

## 2018-03-08 LAB — GLUCOSE, CAPILLARY: Glucose-Capillary: 99 mg/dL (ref 70–99)

## 2018-03-08 NOTE — Care Management (Signed)
RNCM spoke with Shanon Brow AOD for the [facility]. He tells me the patient has been accepted and that their Doctors have reviewed the chart and agree with the transfer. There is no further documents needed at this time and transfer will be complete pending bed availability. I will call to update the daughter and notify the nursing staff.  Ines Bloomer RN BSN RNCM [phone]

## 2018-03-08 NOTE — Progress Notes (Signed)
[place] at [place] NAME: Erik Carey    MR#:  [id number]  DATE OF BIRTH:  1935-01-18  SUBJECTIVE:  CHIEF COMPLAINT:   Chief Complaint  Patient presents with  . Shortness of Breath  . Respiratory Distress  . Atrial Fibrillation   - no significant change, feels about the same - awaiting transfer to [facility] - remains on 1.5 L o2 which is acute  REVIEW OF SYSTEMS:  Review of Systems  Constitutional: Negative for chills and fever.  HENT: Negative for congestion, ear discharge, hearing loss and nosebleeds.   Eyes: Negative for blurred vision and double vision.  Respiratory: Positive for cough and shortness of breath. Negative for wheezing.   Cardiovascular: Positive for chest pain. Negative for palpitations and leg swelling.  Gastrointestinal: Negative for abdominal pain, constipation, diarrhea, nausea and vomiting.  Genitourinary: Negative for dysuria.  Musculoskeletal: Negative for myalgias.  Neurological: Negative for dizziness, focal weakness, seizures, weakness and headaches.  Psychiatric/Behavioral: Negative for depression.    DRUG ALLERGIES:  No Known Allergies  VITALS:  Blood pressure (!) 142/62, pulse 64, temperature 97.7 F (36.5 C), temperature source Oral, resp. rate 16, height 5\' 9"  (1.753 m), weight 98.2 kg, SpO2 95 %.  PHYSICAL EXAMINATION:  Physical Exam   GENERAL:  83 y.o.-year-old obese patient lying in the bed with no acute distress.  EYES: Pupils equal, round, reactive to light and accommodation. No scleral icterus. Extraocular muscles intact.  HEENT: Head atraumatic, normocephalic. Oropharynx and nasopharynx clear.  NECK:  Supple, no jugular venous distention. No thyroid enlargement, no tenderness.  LUNGS: normal breath sounds on the left side but much decreased on the right base., no wheezing, rales,rhonchi or crepitation. No use of accessory muscles of respiration.  CARDIOVASCULAR: S1, S2 normal. No murmurs,  rubs, or gallops.  ABDOMEN: Soft, nontender, nondistended. Bowel sounds present. No organomegaly or mass.  EXTREMITIES: No pedal edema, cyanosis, or clubbing.  NEUROLOGIC: Cranial nerves II through XII are intact. Muscle strength 5/5 in all extremities. Sensation intact. Gait not checked.  PSYCHIATRIC: The patient is alert and oriented x 3.  SKIN: No obvious rash, lesion, or ulcer.    LABORATORY PANEL:   CBC Recent Labs  Lab 03/07/18 0646  WBC 14.2*  HGB 9.2*  HCT 28.6*  PLT 206   ------------------------------------------------------------------------------------------------------------------  Chemistries  Recent Labs  Lab 03/01/18 1820  03/07/18 0646  NA 140   < > 142  K 3.7   < > 3.5  CL 105   < > 108  CO2 21*   < > 28  GLUCOSE 139*   < > 95  BUN 68*   < > 25*  CREATININE 1.75*   < > 1.07  CALCIUM 8.2*   < > 7.7*  MG 2.1  --   --    < > = values in this interval not displayed.   ------------------------------------------------------------------------------------------------------------------  Cardiac Enzymes Recent Labs  Lab 03/01/18 1820  TROPONINI 0.03*   ------------------------------------------------------------------------------------------------------------------  RADIOLOGY:  No results found.  EKG:   Orders placed or performed during the hospital encounter of 03/01/18  . ED EKG  . ED EKG    ASSESSMENT AND PLAN:   83 year old male with past medical history significant for COPD, hypertension, stroke and ongoing smoking presents to hospital secondary to worsening shortness of breath.  1.  Acute hypoxic respiratory failure-secondary to pneumonia and empyema -Appreciate pulmonary consult- -Initial CT chest showing no pulmonary embolism but possible endobronchial mass and  multiloculated pleural effusion. -Status post bronc and biopsy and the bronchial washings are negative for any tumor cells.  Patient also had thoracentesis and 300 cc of clear  fluid removed and negative for cancer cells on cytology -Repeat CT on 03/06/18 still showing multiloculated effusion and pneumonia.  Concern more for infection causing the obstruction rather than tumor based on pulmonary input -Patient will need thoracotomy/VATS for infection to be cleared completely -For now continue Unasyn.    -Steroids have been discontinued. -WBC is much improving  2.  COPD-stable at this time.  Off steroids.  Continue inhalers and nebulizers as needed  3.  Hypo-kalemia-replaced  4.  BPH-on Flomax  5.  Hypertension-on Cardizem  6.  DVT prophylaxis-  Lovenox   Awaiting transfer to [facility].      All the records are reviewed and case discussed with Care Management/Social Workerr. Management plans discussed with the patient, family and they are in agreement.  CODE STATUS: Full Code  TOTAL TIME TAKING CARE OF THIS PATIENT: 36 minutes.   POSSIBLE D/C IN 1-2 DAYS, DEPENDING ON CLINICAL CONDITION.   Gladstone Lighter M.D on 03/08/2018 at 10:21 AM  Between 7am to 6pm - Pager - [phone]  After 6pm go to [email or web address] - password [email or web address] [facility]  Office  [phone]  CC: Primary care physician; Raelyn Mora, MD

## 2018-03-09 NOTE — Progress Notes (Signed)
Pt was transferred to [facility], per MD's order. He was transported by [facility], report was given to [facility] from [place] and [facility]. Report was also called to the [place] and spoke to [place], [place]. Pt went to room 6A. Pt's family was notified. Continue to monitor

## 2018-03-20 ENCOUNTER — Other Ambulatory Visit: Payer: Self-pay

## 2018-03-20 ENCOUNTER — Telehealth: Payer: Self-pay | Admitting: Pulmonary Disease

## 2018-03-20 DIAGNOSIS — R918 Other nonspecific abnormal finding of lung field: Secondary | ICD-10-CM

## 2018-03-20 NOTE — Telephone Encounter (Signed)
Will see 11/26

## 2018-03-20 NOTE — Telephone Encounter (Signed)
Patient daughter scheduled hfu for 11/26 with [facility].  Time frame for fu not know .  Scheduled 1 week out please advise if this is too soon and needs to be rescheduled.  Patient daughter wants 1 week and is wanted to have someone check chest tube site. She was told this would have to be gradually removed.

## 2018-03-23 ENCOUNTER — Telehealth: Payer: Self-pay | Admitting: Cardiothoracic Surgery

## 2018-03-23 NOTE — Telephone Encounter (Signed)
I have called patient's daughter to schedule an appointment per the request from Dr Leonidas Romberg. The daughter states that the patient has been readmitted into the [facility]. She stated that she had to take him to the ED at the [place] this past weekend and he will continue his care at that facility. I will forward this to Dr Genevive Bi as well as to the referring provider office.

## 2018-03-27 ENCOUNTER — Ambulatory Visit: Payer: Medicare Other | Admitting: Cardiothoracic Surgery

## 2018-03-31 ENCOUNTER — Inpatient Hospital Stay: Payer: Medicare Other | Admitting: Pulmonary Disease

## 2018-09-21 ENCOUNTER — Encounter: Payer: Self-pay | Admitting: Anesthesiology

## 2018-11-02 IMAGING — CT CT CHEST W/O CM
2 of 3 series · 15 of 36 positions shown, 18 images · non-contrast
Comparison: Chest CT dated 03/01/2018.

CLINICAL DATA: Several days of cough and increasing shortness of
breath. Former smoker. Empyema.

EXAM:
CT CHEST WITHOUT CONTRAST
TECHNIQUE: Multidetector CT imaging of the chest was performed following the
standard protocol without IV contrast.

[Series 2: thorax · axial · 0.76mm/px · z∈[-950,-654]mm · 12 of 174 slices shown, 15 images]
[im 13/174  mediastinal]
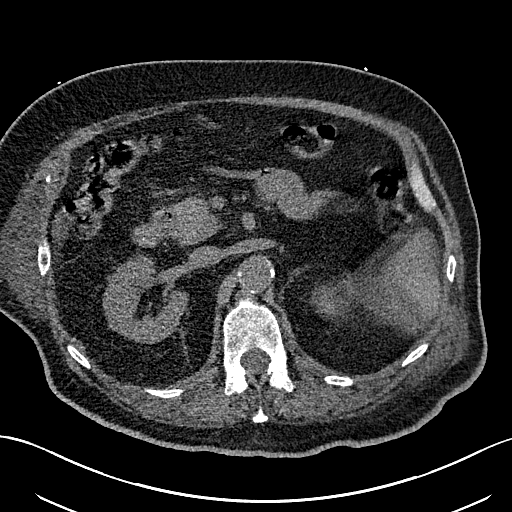
[im 13/174  lung]
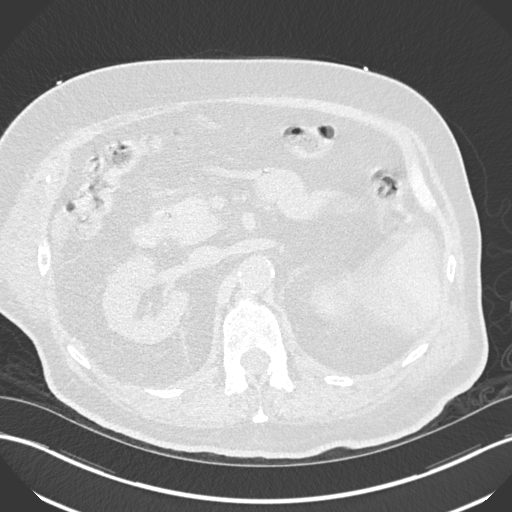
[im 26/174  lung]
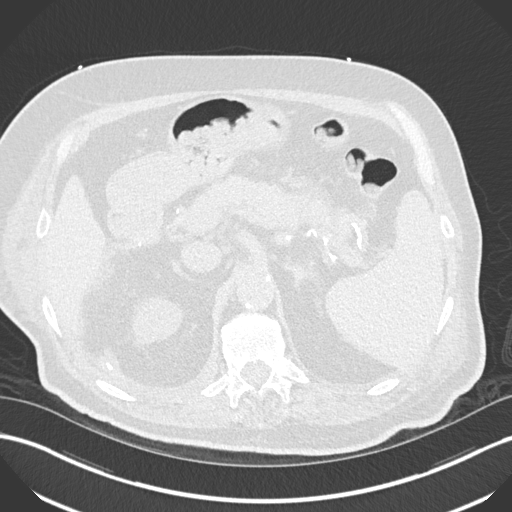
[im 39/174  lung]
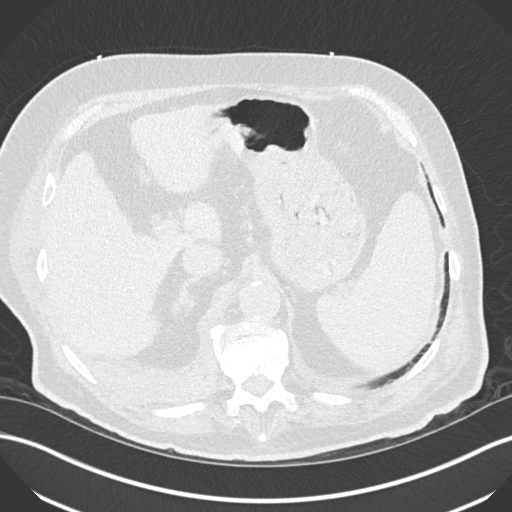
[im 52/174  lung]
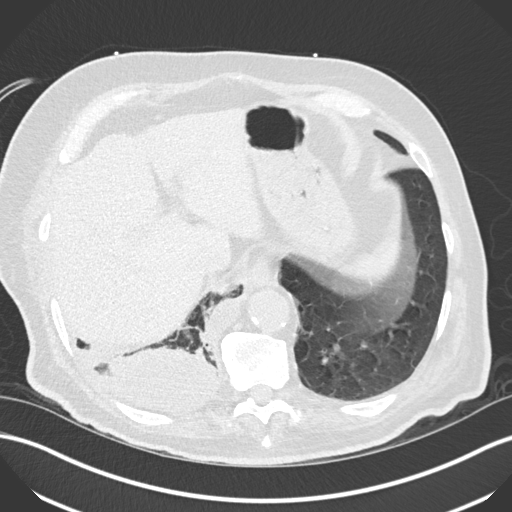
[im 65/174  mediastinal]
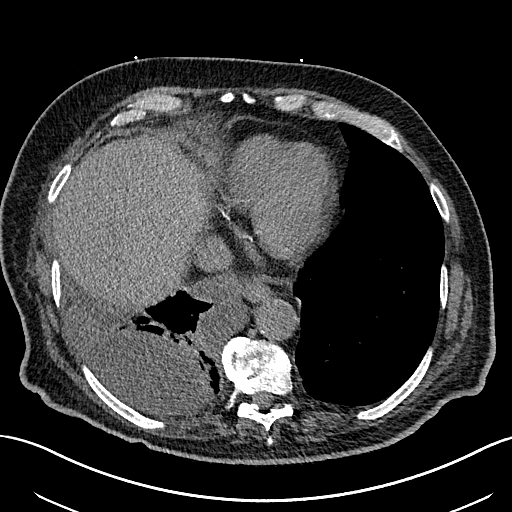
[im 65/174  lung]
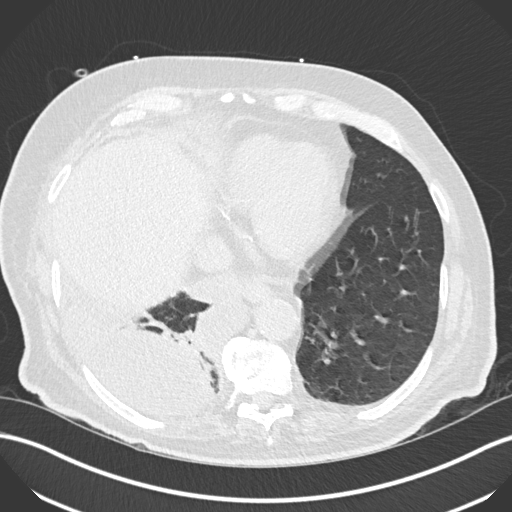
[im 77/174  lung]
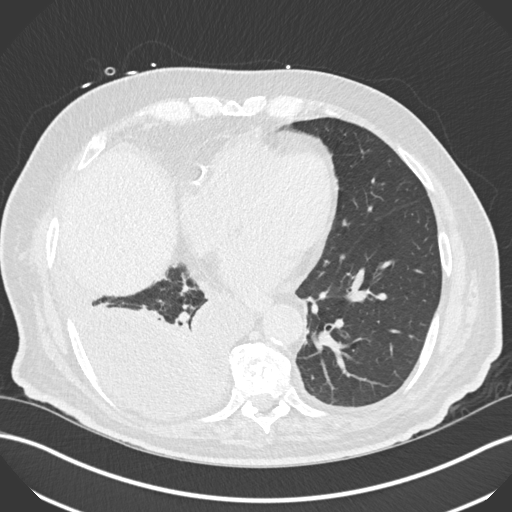
[im 97/174  lung]
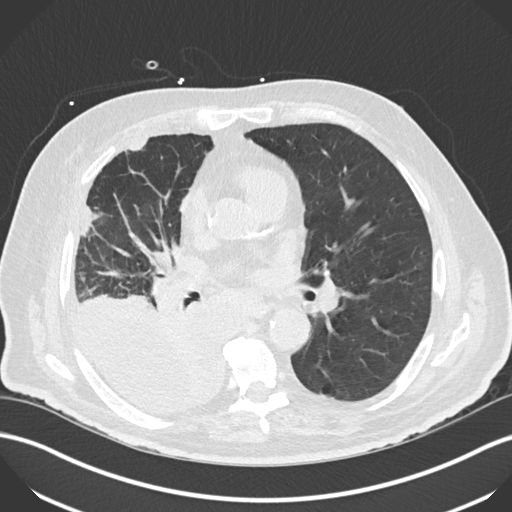
[im 109/174  lung]
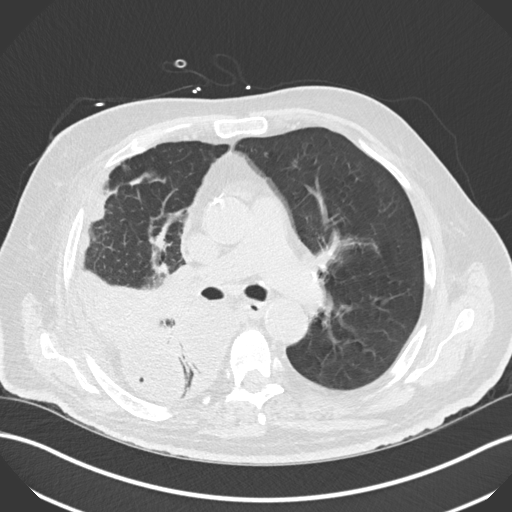
[im 122/174  mediastinal]
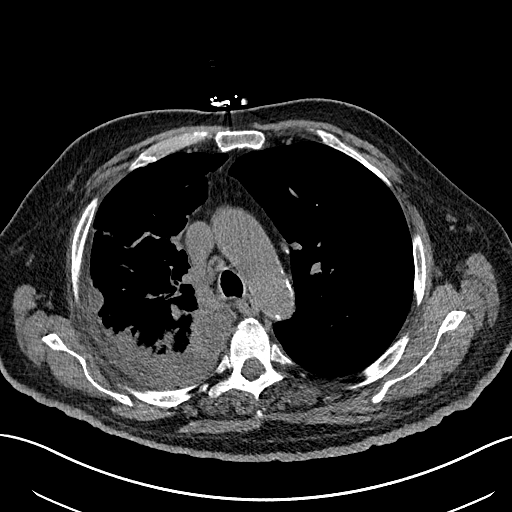
[im 122/174  lung]
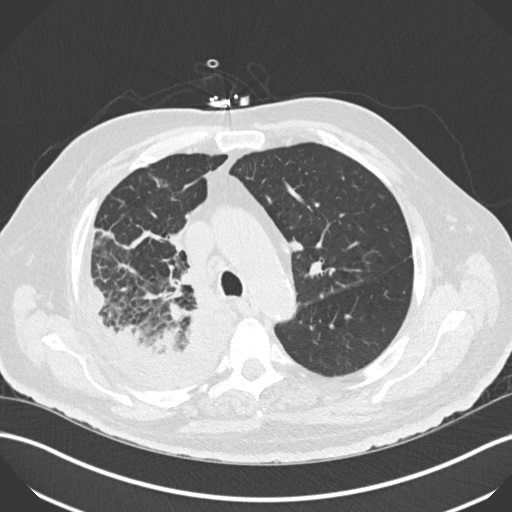
[im 135/174  lung]
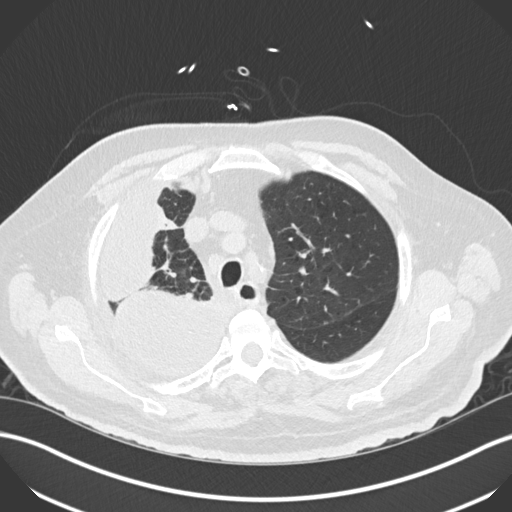
[im 148/174  lung]
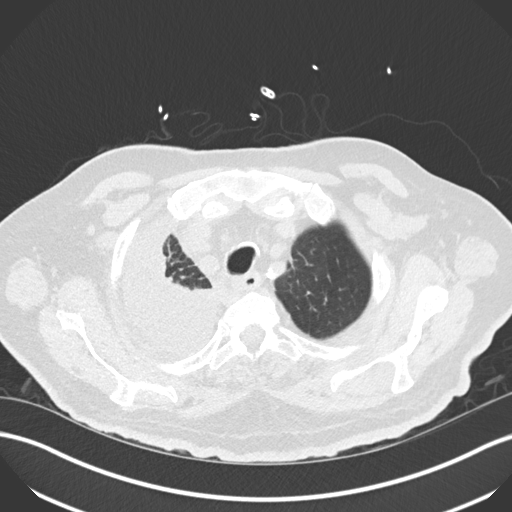
[im 161/174  lung]
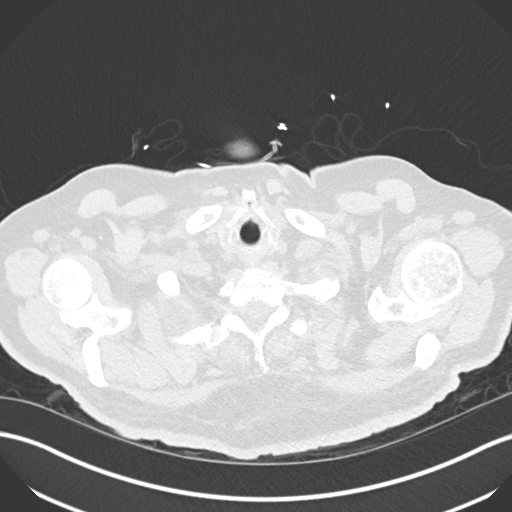

[Series 5: coronal · coronal · 0.69mm/px · 3 of 154 slices shown]
[im 31/154  lung]
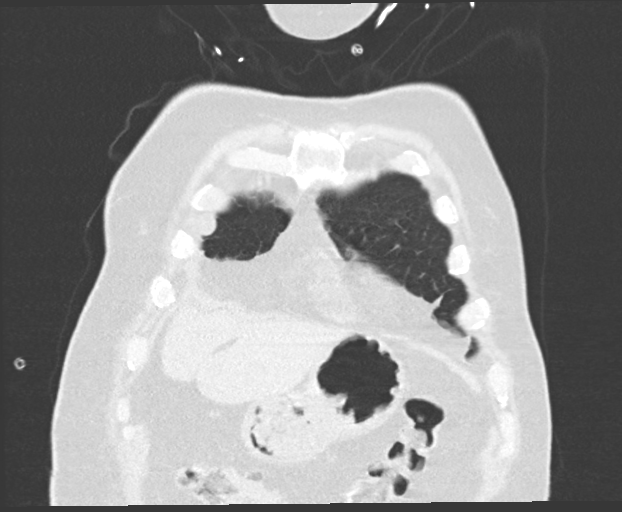
[im 62/154  lung]
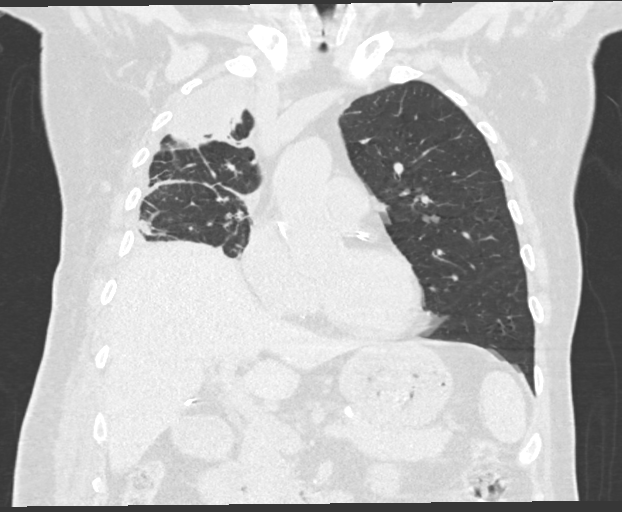
[im 92/154  lung]
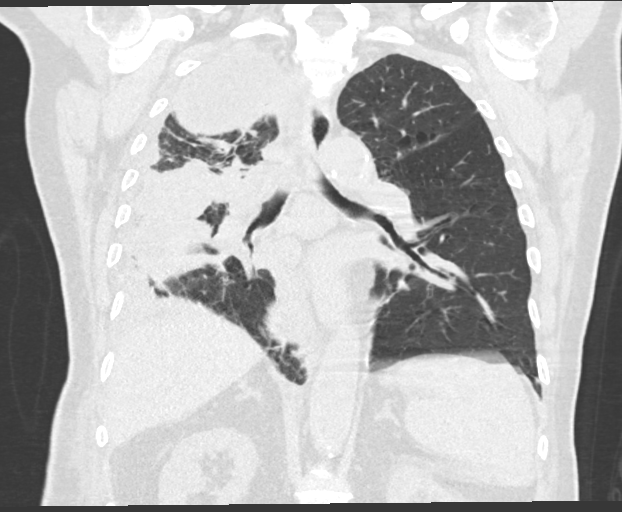

[15 of 36 positions shown; findings below may reference images not displayed]

FINDINGS: Cardiovascular: Scattered aortic atherosclerosis. No thoracic aortic
aneurysm. Heart size is within normal limits. No pericardial
effusion. Coronary artery calcifications noted.

Mediastinum/Nodes: Scattered mildly prominent lymph nodes within the
mediastinum. Esophagus is unremarkable. Trachea appears normal.

Lungs/Pleura: Multiloculated fluid collections within the RIGHT
pleural space are not significantly changed, largest collection at
the RIGHT lung base again measures 10-11 cm greatest dimension.
Masslike consolidation within the RIGHT perihilar lung is stable,
better demonstrated on the earlier contrast-enhanced study, again
measuring approximately 6-7 cm greatest dimension.

LEFT lung is clear.

Upper Abdomen: Limited images of the upper abdomen are unremarkable.

Musculoskeletal: No acute or suspicious osseous finding.
IMPRESSION: 1. Stable appearance of the multiloculated fluid collections filling
a significant portion of the RIGHT hemithorax, largest component at
the RIGHT lung base again measures approximately 10-11 cm greatest
dimension.
2. Persistent masslike consolidation within the RIGHT perihilar
lung, again measuring 6-7 cm greatest dimension, better demonstrated
on earlier contrast-enhanced study of 03/01/2018, suspicious for
neoplastic process on the earlier study.
3. LEFT lung is clear.

Aortic Atherosclerosis ([id number]-IUR.R).

## 2019-08-25 ENCOUNTER — Ambulatory Visit (INDEPENDENT_AMBULATORY_CARE_PROVIDER_SITE_OTHER): Payer: Medicare Other | Admitting: Podiatry

## 2019-08-25 ENCOUNTER — Other Ambulatory Visit: Payer: Self-pay

## 2019-08-25 VITALS — Temp 98.4°F

## 2019-08-25 DIAGNOSIS — B351 Tinea unguium: Secondary | ICD-10-CM | POA: Diagnosis not present

## 2019-08-25 DIAGNOSIS — M79676 Pain in unspecified toe(s): Secondary | ICD-10-CM | POA: Diagnosis not present

## 2019-08-25 NOTE — Progress Notes (Signed)
He presents today chief complaint of painfully elongated toenails.  Objective: Nails are long thick yellow dystrophic clinically mycotic and painful palpation as well as debridement.  Assessment: Pain limb secondary to onychomycosis.  Plan: Debridement of toenails 1 through 5 bilateral.

## 2019-11-24 ENCOUNTER — Ambulatory Visit (INDEPENDENT_AMBULATORY_CARE_PROVIDER_SITE_OTHER): Payer: Medicare Other | Admitting: Podiatry

## 2019-11-24 ENCOUNTER — Other Ambulatory Visit: Payer: Self-pay

## 2019-11-24 ENCOUNTER — Other Ambulatory Visit: Payer: Self-pay | Admitting: Podiatry

## 2019-11-24 ENCOUNTER — Encounter: Payer: Self-pay | Admitting: Podiatry

## 2019-11-24 ENCOUNTER — Ambulatory Visit (INDEPENDENT_AMBULATORY_CARE_PROVIDER_SITE_OTHER): Payer: Medicare Other

## 2019-11-24 DIAGNOSIS — B351 Tinea unguium: Secondary | ICD-10-CM | POA: Diagnosis not present

## 2019-11-24 DIAGNOSIS — M79676 Pain in unspecified toe(s): Secondary | ICD-10-CM

## 2019-11-24 DIAGNOSIS — M779 Enthesopathy, unspecified: Secondary | ICD-10-CM

## 2019-11-24 DIAGNOSIS — M778 Other enthesopathies, not elsewhere classified: Secondary | ICD-10-CM | POA: Diagnosis not present

## 2019-11-24 NOTE — Progress Notes (Signed)
He presents today for follow-up of his toenails.  He states that he feels pins-and-needles in his toes and feels like his toes are broken.  He is requesting x-rays and he states that his painful toenails are growing back.  Objective: Vital signs are stable he is alert oriented x3.  There is no erythema edema cellulitis drainage or odor he has pain on palpation of his toenails.  He has tenderness on range of motion of the toes.  Radiographs taken today do not demonstrate any type of fractures just osteoarthritic changes of the midfoot and digits.  Assessment: Pain in limb secondary to onychomycosis and osteoarthritis.  Plan: Debridement of toenails 1 through 5 bilaterally.  Recommended Voltaren gel application twice a day at least for the pains in his feet.

## 2020-01-21 ENCOUNTER — Telehealth: Payer: Self-pay | Admitting: Podiatry

## 2020-01-21 NOTE — Telephone Encounter (Signed)
[facility] extension faxed on 12/09/19

## 2020-03-01 ENCOUNTER — Ambulatory Visit: Payer: Medicare Other | Admitting: Podiatry

## 2020-09-21 ENCOUNTER — Ambulatory Visit: Payer: Non-veteran care | Admitting: Podiatry

## 2020-09-28 ENCOUNTER — Ambulatory Visit (INDEPENDENT_AMBULATORY_CARE_PROVIDER_SITE_OTHER): Payer: Non-veteran care | Admitting: Podiatry

## 2020-09-28 ENCOUNTER — Other Ambulatory Visit: Payer: Self-pay

## 2020-09-28 DIAGNOSIS — M79676 Pain in unspecified toe(s): Secondary | ICD-10-CM | POA: Diagnosis not present

## 2020-09-28 DIAGNOSIS — B351 Tinea unguium: Secondary | ICD-10-CM | POA: Diagnosis not present

## 2020-10-03 ENCOUNTER — Encounter: Payer: Self-pay | Admitting: Podiatry

## 2020-10-03 NOTE — Progress Notes (Signed)
  Subjective:  Patient ID: Erik Carey, male    DOB: Sep 13, 1934,  MRN: [id number]  Chief Complaint  Patient presents with  . Nail Problem    Thick painful nails    86 y.o. male returns for the above complaint.  Patient presents with thickened elongated dystrophic toenails x10.  Painful to touch.  Patient would like to have them debrided down.  Patient is here from [place].  He states is painful with ambulation.  He is not able to do it himself.He is not a diabetic  Objective:  There were no vitals filed for this visit. Podiatric Exam: Vascular: dorsalis pedis and posterior tibial pulses are palpable bilateral. Capillary return is immediate. Temperature gradient is WNL. Skin turgor WNL  Sensorium: Normal Semmes Weinstein monofilament test. Normal tactile sensation bilaterally. Nail Exam: Pt has thick disfigured discolored nails with subungual debris noted bilateral entire nail hallux through fifth toenails.  Pain on palpation to the nails. Ulcer Exam: There is no evidence of ulcer or pre-ulcerative changes or infection. Orthopedic Exam: Muscle tone and strength are WNL. No limitations in general ROM. No crepitus or effusions noted. HAV  B/L.  Hammer toes 2-5  B/L. Skin: No Porokeratosis. No infection or ulcers    Assessment & Plan:   1. Pain due to onychomycosis of toenail     Patient was evaluated and treated and all questions answered.  Onychomycosis with pain  -Nails palliatively debrided as below. -Educated on self-care  Procedure: Nail Debridement Rationale: pain  Type of Debridement: manual, sharp debridement. Instrumentation: Nail nipper, rotary burr. Number of Nails: 10  Procedures and Treatment: Consent by patient was obtained for treatment procedures. The patient understood the discussion of treatment and procedures well. All questions were answered thoroughly reviewed. Debridement of mycotic and hypertrophic toenails, 1 through 5 bilateral and clearing of subungual  debris. No ulceration, no infection noted.  Return Visit-Office Procedure: Patient instructed to return to the office for a follow up visit 3 months for continued evaluation and treatment.  Boneta Lucks, DPM    Return in about 3 months (around 12/29/2020).

## 2021-01-04 ENCOUNTER — Ambulatory Visit: Payer: Non-veteran care | Admitting: Podiatry

## 2021-05-08 ENCOUNTER — Ambulatory Visit: Payer: Medicare Other | Admitting: Podiatry

## 2022-01-08 ENCOUNTER — Emergency Department: Payer: No Typology Code available for payment source

## 2022-01-08 ENCOUNTER — Emergency Department
Admission: EM | Admit: 2022-01-08 | Discharge: 2022-01-08 | Disposition: A | Discharge status: Home / Self Care | Payer: No Typology Code available for payment source | Attending: Emergency Medicine | Admitting: Emergency Medicine

## 2022-01-08 ENCOUNTER — Other Ambulatory Visit: Payer: Self-pay

## 2022-01-08 DIAGNOSIS — U071 COVID-19: Secondary | ICD-10-CM | POA: Insufficient documentation

## 2022-01-08 DIAGNOSIS — R296 Repeated falls: Secondary | ICD-10-CM

## 2022-01-08 DIAGNOSIS — R0602 Shortness of breath: Secondary | ICD-10-CM | POA: Diagnosis present

## 2022-01-08 LAB — SARS CORONAVIRUS 2 BY RT PCR: SARS Coronavirus 2 by RT PCR: POSITIVE — AB

## 2022-01-08 MED ORDER — NIRMATRELVIR/RITONAVIR (PAXLOVID) TABLET (RENAL DOSING): 2.0000 | ORAL_TABLET | Freq: Two times a day (BID) | ORAL | 0 refills | Status: AC

## 2022-01-08 NOTE — ED Triage Notes (Addendum)
Pt comes with c/o mechanical fall today. Pt denies any blood thinners or loc. Pt denies hitting head. Pt denies any pain. Pt states he was sitting on edge of bed and slipped off.  Family at home does have covid per EMs.

## 2022-01-08 NOTE — Discharge Instructions (Addendum)
Take over-the-counter vitamin C, vitamin D, and zinc to help boost your immune system Take over-the-counter Mucinex for cough as needed Take over-the-counter Tylenol and ibuprofen for fever and body aches Follow-up with your regular doctor as needed Return emergency department worsening Quarantine for 5 days if you are vaccinated and 10 days if you are not.  Erik Carey is from onset of symptoms.  At that time you may return to school/work if your symptoms have improved and you have been fever free for 24 hours.

## 2022-01-08 NOTE — ED Notes (Signed)
Pt states he slid out of bed this morning. Denies any LOC. Denies hitting head. Denies any pain from fall. Pt states he started having a sore throat last night and has a non productive cough.

## 2022-01-08 NOTE — ED Provider Notes (Signed)
[facility] Provider Note    Event Date/Time   First MD Initiated Contact with Patient 01/08/22 1054     (approximate)   History   Fall   HPI  Erik Carey is a 87 y.o. male with history of CVA, hypertension, COPD, left-sided weakness and multiple falls presents emergency department complaining of a fall this morning.  He states he slid out of the bed.  He fell 2 days ago and hit his head.  States his right hip hurts.  No numbness or tingling.  States he does feel little short of breath and his family has been positive for COVID this week.      Physical Exam   Triage Vital Signs: ED Triage Vitals  Enc Vitals Group     BP 01/08/22 1022 133/81     Pulse Rate 01/08/22 1022 (!) 101     Resp 01/08/22 1022 18     Temp 01/08/22 1022 97.6 F (36.4 C)     Temp Source 01/08/22 1022 Oral     SpO2 01/08/22 1022 98 %     Weight --      Height --      Head Circumference --      Peak Flow --      Pain Score 01/08/22 1020 0     Pain Loc --      Pain Edu? --      Excl. in [place]? --     Most recent vital signs: Vitals:   01/08/22 1022  BP: 133/81  Pulse: (!) 101  Resp: 18  Temp: 97.6 F (36.4 C)  SpO2: 98%     General: Awake, no distress.   CV:  Good peripheral perfusion. regular rate and  rhythm Resp:  Normal effort. Lungs seen today Abd:  No distention.   Other:  Abrasion noted to the right side of his forehead, C-spines mildly tender, right hip is mildly tender, no foot rotation, neurovascular intact   ED Results / Procedures / Treatments   Labs (all labs ordered are listed, but only abnormal results are displayed) Labs Reviewed  SARS CORONAVIRUS 2 BY RT PCR - Abnormal; Notable for the following components:      Result Value   SARS Coronavirus 2 by RT PCR POSITIVE (*)    All other components within normal limits     EKG     RADIOLOGY Chest x-ray, x-ray of the right hip, CT of the head and  C-spine    PROCEDURES:   Procedures   MEDICATIONS ORDERED IN ED: Medications - No data to display   IMPRESSION / MDM / [place] / ED COURSE  I reviewed the triage vital signs and the nursing notes.                              Differential diagnosis includes, but is not limited to, multiple falls, contusion, fracture, sprain, subdural, SAH,'s covid, PNA  Patient's presentation is most consistent with acute presentation with potential threat to life or bodily function.   COVID test, chest x-ray, CT of the head and C-spine, x-ray of the right hip   CT of the head, C-spine were independently reviewed by me and interpreted as being negative  Chest x-ray and x-ray of the right hip were independently reviewed by me and interpreted as being negative.  All imaging was confirmed by radiology.  Patient's COVID test is positive.  He will be placed on the renal dosing of Paxlovid.  Patient has chronic kidney disease and I do not feel he needs full dose.  His family members are also sick.  He is to follow-up with his regular doctor if not improving.  Return emergency department worsening.  He is in agreement treatment plan.  Discharged stable condition.   FINAL CLINICAL IMPRESSION(S) / ED DIAGNOSES   Final diagnoses:  COVID  Multiple falls     Rx / DC Orders   ED Discharge Orders          Ordered    nirmatrelvir/ritonavir EUA, renal dosing, (PAXLOVID) 10 x 150 MG & 10 x '100MG'$  TABS  2 times daily        01/08/22 1246             Note:  This document was prepared using Dragon voice recognition software and may include unintentional dictation errors.    Versie Starks, PA-C 01/08/22 1540    Blake Divine, MD 01/08/22 [phone]
